# Patient Record
Sex: Female | Born: 1980 | Race: Black or African American | Hispanic: No | Marital: Single | Smoking: Never smoker
Health system: Southern US, Community
[De-identification: ages and names within clinical notes are randomized; demographics above are authoritative.]

## PROBLEM LIST (undated history)

## (undated) ENCOUNTER — Emergency Department: Payer: BLUE CROSS/BLUE SHIELD

## (undated) ENCOUNTER — Emergency Department (HOSPITAL_COMMUNITY): Payer: BLUE CROSS/BLUE SHIELD | Source: Home / Self Care

## (undated) DIAGNOSIS — G8929 Other chronic pain: Secondary | ICD-10-CM

## (undated) DIAGNOSIS — I2699 Other pulmonary embolism without acute cor pulmonale: Secondary | ICD-10-CM

## (undated) DIAGNOSIS — M549 Dorsalgia, unspecified: Secondary | ICD-10-CM

## (undated) DIAGNOSIS — I82409 Acute embolism and thrombosis of unspecified deep veins of unspecified lower extremity: Secondary | ICD-10-CM

## (undated) HISTORY — PX: WRIST FRACTURE SURGERY: SHX121

## (undated) HISTORY — PX: WISDOM TOOTH EXTRACTION: SHX21

## (undated) HISTORY — PX: BACK SURGERY: SHX140

---

## 2014-02-28 ENCOUNTER — Inpatient Hospital Stay (HOSPITAL_BASED_OUTPATIENT_CLINIC_OR_DEPARTMENT_OTHER)
Admission: EM | Admit: 2014-02-28 | Discharge: 2014-03-03 | DRG: 176 | Disposition: A | Discharge status: Home / Self Care | Payer: BLUE CROSS/BLUE SHIELD | Attending: Internal Medicine | Admitting: Internal Medicine

## 2014-02-28 ENCOUNTER — Emergency Department (HOSPITAL_BASED_OUTPATIENT_CLINIC_OR_DEPARTMENT_OTHER): Payer: BLUE CROSS/BLUE SHIELD

## 2014-02-28 ENCOUNTER — Encounter (HOSPITAL_BASED_OUTPATIENT_CLINIC_OR_DEPARTMENT_OTHER): Payer: Self-pay | Admitting: *Deleted

## 2014-02-28 DIAGNOSIS — M5127 Other intervertebral disc displacement, lumbosacral region: Secondary | ICD-10-CM | POA: Diagnosis present

## 2014-02-28 DIAGNOSIS — R0602 Shortness of breath: Secondary | ICD-10-CM | POA: Diagnosis present

## 2014-02-28 DIAGNOSIS — I82432 Acute embolism and thrombosis of left popliteal vein: Secondary | ICD-10-CM | POA: Diagnosis present

## 2014-02-28 DIAGNOSIS — I82412 Acute embolism and thrombosis of left femoral vein: Secondary | ICD-10-CM | POA: Diagnosis present

## 2014-02-28 DIAGNOSIS — R748 Abnormal levels of other serum enzymes: Secondary | ICD-10-CM | POA: Diagnosis present

## 2014-02-28 DIAGNOSIS — Z79899 Other long term (current) drug therapy: Secondary | ICD-10-CM | POA: Diagnosis not present

## 2014-02-28 DIAGNOSIS — M7989 Other specified soft tissue disorders: Secondary | ICD-10-CM | POA: Diagnosis present

## 2014-02-28 DIAGNOSIS — D72829 Elevated white blood cell count, unspecified: Secondary | ICD-10-CM | POA: Diagnosis present

## 2014-02-28 DIAGNOSIS — I2699 Other pulmonary embolism without acute cor pulmonale: Secondary | ICD-10-CM

## 2014-02-28 DIAGNOSIS — I2692 Saddle embolus of pulmonary artery without acute cor pulmonale: Principal | ICD-10-CM | POA: Diagnosis present

## 2014-02-28 DIAGNOSIS — R519 Headache, unspecified: Secondary | ICD-10-CM

## 2014-02-28 DIAGNOSIS — E871 Hypo-osmolality and hyponatremia: Secondary | ICD-10-CM | POA: Diagnosis present

## 2014-02-28 DIAGNOSIS — R51 Headache: Secondary | ICD-10-CM

## 2014-02-28 HISTORY — DX: Dorsalgia, unspecified: M54.9

## 2014-02-28 HISTORY — DX: Other chronic pain: G89.29

## 2014-02-28 LAB — COMPREHENSIVE METABOLIC PANEL
ALT: 34 U/L (ref 0–35)
AST: 34 U/L (ref 0–37)
Albumin: 3.9 g/dL (ref 3.5–5.2)
Alkaline Phosphatase: 96 U/L (ref 39–117)
Anion gap: 8 (ref 5–15)
BUN: 14 mg/dL (ref 6–23)
CO2: 24 mmol/L (ref 19–32)
CREATININE: 0.83 mg/dL (ref 0.50–1.10)
Calcium: 9 mg/dL (ref 8.4–10.5)
Chloride: 102 mmol/L (ref 96–112)
Glucose, Bld: 157 mg/dL — ABNORMAL HIGH (ref 70–99)
POTASSIUM: 4.3 mmol/L (ref 3.5–5.1)
Sodium: 134 mmol/L — ABNORMAL LOW (ref 135–145)
Total Bilirubin: 0.2 mg/dL — ABNORMAL LOW (ref 0.3–1.2)
Total Protein: 8.4 g/dL — ABNORMAL HIGH (ref 6.0–8.3)

## 2014-02-28 LAB — APTT
APTT: 37 s (ref 24–37)
aPTT: 98 seconds — ABNORMAL HIGH (ref 24–37)
aPTT: 69 seconds — ABNORMAL HIGH (ref 24–37)

## 2014-02-28 LAB — MRSA PCR SCREENING: MRSA by PCR: NEGATIVE

## 2014-02-28 LAB — CBC
HEMATOCRIT: 39.5 % (ref 36.0–46.0)
HEMATOCRIT: 36.6 % (ref 36.0–46.0)
HEMOGLOBIN: 13.3 g/dL (ref 12.0–15.0)
HEMOGLOBIN: 12.3 g/dL (ref 12.0–15.0)
MCH: 28.2 pg (ref 26.0–34.0)
MCH: 28 pg (ref 26.0–34.0)
MCHC: 33.7 g/dL (ref 30.0–36.0)
MCHC: 33.6 g/dL (ref 30.0–36.0)
MCV: 83.9 fL (ref 78.0–100.0)
MCV: 83.4 fL (ref 78.0–100.0)
PLATELETS: 210 10*3/uL (ref 150–400)
Platelets: 185 10*3/uL (ref 150–400)
RBC: 4.71 MIL/uL (ref 3.87–5.11)
RBC: 4.39 MIL/uL (ref 3.87–5.11)
RDW: 14.4 % (ref 11.5–15.5)
RDW: 14.3 % (ref 11.5–15.5)
WBC: 19.1 10*3/uL — ABNORMAL HIGH (ref 4.0–10.5)
WBC: 24.3 10*3/uL — AB (ref 4.0–10.5)

## 2014-02-28 LAB — PROTIME-INR
INR: 1.11 (ref 0.00–1.49)
Prothrombin Time: 14.3 seconds (ref 11.6–15.2)
Prothrombin Time: >90 seconds — ABNORMAL HIGH (ref 11.6–15.2)

## 2014-02-28 LAB — TROPONIN I: TROPONIN I: 0.2 ng/mL — AB (ref <0.031)

## 2014-02-28 LAB — GLUCOSE, CAPILLARY: Glucose-Capillary: 163 mg/dL — ABNORMAL HIGH (ref 70–99)

## 2014-02-28 MED ORDER — ALTEPLASE (PULMONARY EMBOLISM) INFUSION
100.0000 mg | Freq: Once | INTRAVENOUS | Status: AC
  Administered 2014-02-28: 100 mg via INTRAVENOUS
  Filled 2014-02-28: qty 100

## 2014-02-28 MED ORDER — HYDROMORPHONE HCL 1 MG/ML IJ SOLN
1.0000 mg | Freq: Once | INTRAMUSCULAR | Status: AC
  Administered 2014-02-28: 1 mg via INTRAVENOUS
  Filled 2014-02-28: qty 1

## 2014-02-28 MED ORDER — MIDAZOLAM HCL 2 MG/2ML IJ SOLN
INTRAMUSCULAR | Status: AC
  Filled 2014-02-28: qty 2

## 2014-02-28 MED ORDER — SODIUM CHLORIDE 0.9 % IV SOLN
250.0000 mL | Freq: Once | INTRAVENOUS | Status: AC
  Administered 2014-02-28: 250 mL via INTRAVENOUS

## 2014-02-28 MED ORDER — SODIUM CHLORIDE 0.9 % IV SOLN
INTRAVENOUS | Status: DC
  Administered 2014-02-28 – 2014-03-01 (×3): via INTRAVENOUS

## 2014-02-28 MED ORDER — HEPARIN (PORCINE) IN NACL 100-0.45 UNIT/ML-% IJ SOLN
1700.0000 [IU]/h | INTRAMUSCULAR | Status: AC
  Administered 2014-02-28: 1100 [IU]/h via INTRAVENOUS
  Administered 2014-03-01: 1350 [IU]/h via INTRAVENOUS
  Administered 2014-03-02: 1700 [IU]/h via INTRAVENOUS
  Filled 2014-02-28 (×5): qty 250

## 2014-02-28 MED ORDER — HEPARIN (PORCINE) IN NACL 100-0.45 UNIT/ML-% IJ SOLN
1600.0000 [IU]/h | INTRAMUSCULAR | Status: DC
  Administered 2014-02-28: 1600 [IU]/h via INTRAVENOUS

## 2014-02-28 MED ORDER — FENTANYL CITRATE 0.05 MG/ML IJ SOLN
25.0000 ug | INTRAMUSCULAR | Status: DC | PRN
  Administered 2014-02-28: 50 ug via INTRAVENOUS

## 2014-02-28 MED ORDER — HEPARIN (PORCINE) IN NACL 100-0.45 UNIT/ML-% IJ SOLN
INTRAMUSCULAR | Status: AC
  Filled 2014-02-28: qty 250

## 2014-02-28 MED ORDER — PANTOPRAZOLE SODIUM 40 MG IV SOLR
40.0000 mg | INTRAVENOUS | Status: DC
  Administered 2014-02-28: 40 mg via INTRAVENOUS
  Filled 2014-02-28 (×2): qty 40

## 2014-02-28 MED ORDER — FENTANYL CITRATE 0.05 MG/ML IJ SOLN
INTRAMUSCULAR | Status: AC
  Filled 2014-02-28: qty 2

## 2014-02-28 MED ORDER — IOHEXOL 350 MG/ML SOLN
100.0000 mL | Freq: Once | INTRAVENOUS | Status: AC | PRN
  Administered 2014-02-28: 100 mL via INTRAVENOUS

## 2014-02-28 MED ORDER — HEPARIN BOLUS VIA INFUSION
5000.0000 [IU] | Freq: Once | INTRAVENOUS | Status: AC
  Administered 2014-02-28 (×2): 5000 [IU] via INTRAVENOUS

## 2014-02-28 MED ORDER — FENTANYL CITRATE 0.05 MG/ML IJ SOLN
25.0000 ug | INTRAMUSCULAR | Status: DC | PRN
  Administered 2014-02-28 – 2014-03-01 (×11): 50 ug via INTRAVENOUS
  Filled 2014-02-28 (×8): qty 2

## 2014-02-28 NOTE — ED Notes (Signed)
MD at bedside. 

## 2014-02-28 NOTE — Procedures (Signed)
Echo liited  1. LV hyperdynamic, LVH 2. No sig pericardial effusion 3. RV severe dilation, sever hypokinetic, D shape ventricle with gross RV overload and septal flat  Mcarthur Rossettianiel J. Tyson AliasFeinstein, MD, FACP Pgr: [phone] [facility]

## 2014-02-28 NOTE — ED Notes (Signed)
Patient transported to X-ray 

## 2014-02-28 NOTE — Progress Notes (Addendum)
ANTICOAGULATION CONSULT NOTE - Follow Up Consult  Pharmacy Consult for Heparin  Indication: pulmonary embolus, s/p 2hr tPA infusion   Allergies  Allergen Reactions  . Mobic [Meloxicam] Swelling    Patient Measurements: Height: 5\' 11"  (180.3 cm) Weight: 229 lb (103.874 kg) IBW/kg (Calculated) : 70.8  Vital Signs: Temp: 98.7 F (37.1 C) (02/04 2001) Temp Source: Oral (02/04 2001) BP: 111/69 mmHg (02/04 2300) Pulse Rate: 103 (02/04 2300)  Labs:  Recent Labs  02/28/14 1355 02/28/14 2120 02/28/14 2314  HGB 13.3 12.3  --   HCT 39.5 36.6  --   PLT 210 185  --   APTT 37 98* 69*  LABPROT 14.3 >90.0*  --   INR 1.11 >10.00*  --   CREATININE 0.83  --   --   TROPONINI 0.20*  --   --     Estimated Creatinine Clearance: 126.6 mL/min (by C-G formula based on Cr of 0.83).  Assessment: Large PE, aPTT now less than 80 s/p 2hr tPA infusion, to start heparin.   Goal of Therapy:  Heparin level 0.3-0.5 units/mL (s/p tPA) Monitor platelets by anticoagulation protocol: Yes   Plan:  -Start heparin drip at 1100 units/hr -0700 HL -Daily CBC/HL -Monitor for bleeding -F/U start of oral anti-coagulation   Paula Mack 02/28/2014,11:52 PM =============================================  Addendum 7:23 AM HL this AM 0.1, no issues per RN -Increase heparin drip to 1350 units/hr -1400 HL JLedford, PharmD

## 2014-02-28 NOTE — ED Notes (Signed)
Called to US , pt c/o increased SOB and being lightheaded. O2 sat 88 percent , RR 32 HR 145, O2 increased to 4 liters and nurse stayed with pt until us completed.

## 2014-02-28 NOTE — ED Notes (Addendum)
Pt c/o being short of breath and right side chest pain since yesterday. She denies any other symptoms. She is scheduled for back surgery next week.

## 2014-02-28 NOTE — H&P (Signed)
PULMONARY / CRITICAL CARE MEDICINE   Name: Paula Mack MRN: [id number] DOB: July 11, 1980    ADMISSION DATE:  02/28/2014 CONSULTATION DATE:  02/28/2014  REFERRING MD :  [facility]  CHIEF COMPLAINT:  Shortness of breath  INITIAL PRESENTATION: Presented from [facility] after acute PE found on CTA with associated right heart strain with an RV/LV ratio of 3.0. Paula Mack started on heparin and arrived tachycardic up to 140s, initially on [place] with desat down to 88% and escalated to non-rebreather. Unstable hemodynamically unstable equivalent with rr 40, 100% nrb to receive TPA by IR.  STUDIES:  LE duplex (2/4): nonocclusive thrombus in L fem/popliteal v CTA (2/4): acute PE w/ r. Heart strain MRSA 2/4>> Factor 5 leiden 2/4>> Homocysteine 2/4>> Prothrombin gene mutation 2/4>>  SIGNIFICANT EVENTS: 2/4: Paula Mack admitted to unit; received systemic TPA  HISTORY OF PRESENT ILLNESS:  Paula Mack presented to [facility] after 3 days of progressive sharp chest pain and shortness of breath. She reports no hemoptysis. Breathing makes her pain worse. She has a history of 3 weeks of low mobility due to back pain from a L5-S1 disc protrusion. She was scheduled for surgery in a week with [facility]. Prior to her immobility, she worked at a job where she was sedentary. She was also using OCPs. No prior history of DVT or PE. No known family history or personal history of clotting disorders.  PAST MEDICAL HISTORY :   has a past medical history of Chronic back pain.  has past surgical history that includes Wisdom tooth extraction. Prior to Admission medications   Medication Sig Start Date End Date Taking? Authorizing Provider  gabapentin (NEURONTIN) 300 MG capsule Take 300 mg by mouth 2 (two) times daily.   Yes Historical Provider, MD  HYDROcodone-acetaminophen (NORCO) 7.5-325 MG per tablet Take 1 tablet by mouth every 6 (six) hours as needed for moderate pain.   Yes Historical Provider,  MD  oxyCODONE-acetaminophen (PERCOCET/ROXICET) 5-325 MG per tablet Take by mouth every 4 (four) hours as needed for severe pain.   Yes Historical Provider, MD  traMADol (ULTRAM) 50 MG tablet Take by mouth every 6 (six) hours as needed.   Yes Historical Provider, MD   Allergies  Allergen Reactions  . Mobic [Meloxicam] Swelling    FAMILY HISTORY:  has no family status information on file.  SOCIAL HISTORY:  reports that she has never smoked. She does not have any smokeless tobacco history on file. She reports that she does not drink alcohol or use illicit drugs.  REVIEW OF SYSTEMS:  Chest pain, shortness of breath  SUBJECTIVE:  Paula Mack reports shortness of breath and chest pain are worsened. Also having back pain as she was recently manipulated on bed. States she usually does not lie flat on her back.   VITAL SIGNS: Temp:  [98.2 F (36.8 C)-98.3 F (36.8 C)] 98.2 F (36.8 C) (02/04 1755) Pulse Rate:  [132-145] 132 (02/04 1800) Resp:  [26-36] 36 (02/04 1800) BP: (95-122)/(62-79) 106/72 mmHg (02/04 1800) SpO2:  [88 %-100 %] 99 % (02/04 1800) Weight:  [229 lb (103.874 kg)] 229 lb (103.874 kg) (02/04 1339) HEMODYNAMICS:   VENTILATOR SETTINGS:    INTAKE / OUTPUT: No intake or output data in the 24 hours ending 02/28/14 1847  PHYSICAL EXAMINATION: General:  Appears in moderate distress Neuro:  Alert, oriented, PERRL HEENT:  [place]/AT, non-rebreather fitted Cardiovascular:  Tachycardia, regular rhythm, no murmurs heard Lungs: anterior auscultation. Right lower base diminished, otherwise clear throughout Abdomen:  Soft, non-tender,  non-distended Musculoskeletal:  Bilateral legs with negligible edema. No swelling or erythema noted. Moves extremities spontaneously Skin:  No cyanosis, no rashes  LABS:  CBC  Recent Labs Lab 02/28/14 1355  WBC 19.1*  HGB 13.3  HCT 39.5  PLT 210   Coag's  Recent Labs Lab 02/28/14 1355  APTT 37  INR 1.11   BMET  Recent Labs Lab  02/28/14 1355  NA 134*  K 4.3  CL 102  CO2 24  BUN 14  CREATININE 0.83  GLUCOSE 157*   Electrolytes  Recent Labs Lab 02/28/14 1355  CALCIUM 9.0   Sepsis Markers No results for input(s): LATICACIDVEN, PROCALCITON, O2SATVEN in the last 168 hours. ABG No results for input(s): PHART, PCO2ART, PO2ART in the last 168 hours. Liver Enzymes  Recent Labs Lab 02/28/14 1355  AST 34  ALT 34  ALKPHOS 96  BILITOT 0.2*  ALBUMIN 3.9   Cardiac Enzymes  Recent Labs Lab 02/28/14 1355  TROPONINI 0.20*   Glucose  Recent Labs Lab 02/28/14 1754  GLUCAP 163*    Imaging  Dg Chest 2 View  02/28/2014   CLINICAL DATA:  Right-sided chest pain and shortness of breath for 2 days  EXAM: CHEST  2 VIEW  COMPARISON:  None.  FINDINGS: There is patchy infiltrate in the lateral right base. Elsewhere lungs are clear. Heart size and pulmonary vascularity are within normal limits. No adenopathy. No bone lesions.  IMPRESSION: Lateral right base infiltrate.   Electronically Signed   By: Bretta Bang M.D.   On: 02/28/2014 14:46   Ct Angio Chest Pe W/cm &/or Wo Cm  02/28/2014   CLINICAL DATA:  Shortness of breath and chest pain for 1 day  EXAM: CT ANGIOGRAPHY CHEST WITH CONTRAST  TECHNIQUE: Multidetector CT imaging of the chest was performed using the standard protocol during bolus administration of intravenous contrast. Multiplanar CT image reconstructions and MIPs were obtained to evaluate the vascular anatomy.  CONTRAST:  OMNIPAQUE IOHEXOL 350 MG/ML SOLN  COMPARISON:  Chest radiograph February 28, 2014  FINDINGS: There is extensive pulmonary emboli arising from both main pulmonary arteries with extension of pulmonary emboli into major branches of both upper and lower lobe pulmonary arteries. There is a small amount of thrombus in the distal main pulmonary outflow tract. There is right heart strain with a right ventricle to left ventricular diameter of approximately 3, markedly abnormal.  There is  no appreciable thoracic aortic aneurysm or dissection.  There is airspace consolidation in the anterior and lateral segments of the right lower lobe. A smaller amount of infiltrate is present in the posterior segment right lower lobe. Elsewhere, the lungs are clear.  There is no appreciable thoracic adenopathy. Mild residual thymic tissue may be within normal limits for age. The pericardium is not thickened.  In the visualized upper abdomen, no lesion is seen. There are no blastic or lytic bone lesions. Thyroid appears normal.  Review of the MIP images confirms the above findings.  IMPRESSION: Positive for acute PE with CTevidence of right heart strain (RV/LV Ratio = 3.0) consistent with at least submassive (intermediate risk) PE. The presence of right heart strain has been associated with an increased risk of morbidity and mortality. Pulmonary emboli extending from the distal main pulmonary outflow tract through both main pulmonary arteries and into multiple branches of the lower and upper lobe pulmonary arteries bilaterally.  Right lower lobe airspace consolidation.  No adenopathy.  Critical Value/emergent results were called by telephone at the time of  interpretation on 02/28/2014 at 3:35 pm to Dr. Linwood Dibbles , who verbally acknowledged these results.   Electronically Signed   By: Bretta Bang M.D.   On: 02/28/2014 15:36   US Venous Img Lower Unilateral Left  02/28/2014   CLINICAL DATA:  Left leg swelling below the knee for 3 months. Shortness of breath for 2 days.  EXAM: LEFT LOWER EXTREMITY VENOUS DOPPLER ULTRASOUND  TECHNIQUE: Gray-scale sonography with graded compression, as well as color Doppler and duplex ultrasound, were performed to evaluate the deep venous system from the level of the common femoral vein through the popliteal and proximal calf veins. Spectral Doppler was utilized to evaluate flow at rest and with distal augmentation maneuvers.  COMPARISON:  None.  FINDINGS: The right common femoral  vein is patent.  Normal compressibility, color Doppler flow and phasicity in the left common femoral vein. The left saphenofemoral junction is patent. Left profunda femoral vein is patent. Partial compressibility of the left femoral vein with some color Doppler flow. 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 Partial compressibility of the left popliteal vein with some flow. Findings are compatible with nonocclusive thrombus in the left femoral vein and left popliteal vein.  Images of the left calf are limited. According to the sonographer, the peroneal veins in the mid calf did not compress and at least 1 posterior tibial vein in the mid calf did not compress.  Visualized left great saphenous vein is patent.  IMPRESSION: Positive for deep vein thrombosis in the left lower extremity. There is nonocclusive thrombus in the left femoral vein and left popliteal vein. Probable deep vein thrombosis involving left calf veins which are not well imaged.   Electronically Signed   By: Richarda Overlie M.D.   On: 02/28/2014 15:21    ASSESSMENT / PLAN:  PULMONARY A: Massive acute saddle PE. Spoke with physician from [place] who confirmed no risk with using TPA with patients current L5-S1 bulging disc Respiratory distress, tachypnea P:   Oxygen to keep O2 sat >94% Immediate systemic TPA since Paula Mack is becoming increasingly unstable with concern rapid deterioration Would NOT send to IR, risk of declines in setting of drip slow and transfer too high Restart heparin after TPA for ptt evalaution See heme IS  CARDIOVASCULAR A:  Right heart strain w/ RV/LV ratio of 3.0, Occult RV failure on my echo Sinus tachycardia secondary to PE Elevated troponin 0.2, rv failure related R/o LVH ( i saw on echo) R./o any prior pa htn ( unlikely with acute presentation ) P:  Manage as above Monitor blood pressures. MAP goal >65 official echo in am  Volume to some extent  RENAL A:    Hyponatremia  P:   Monitor metabolic panel IVF NS@125ml /hr Foley catheter  GASTROINTESTINAL A:  No acute issues P:  NPO NS @ 185ml/hr  HEMATOLOGIC A:  Leukocytosis, risk ICH P:  Monitor CBC TPA and heparin as outlined above Neurochecks, ensure blood type in case cryo needed if neuro change limit blood draws  INFECTIOUS A: No acute issues P:   MRSA PCR 2/4>>  ENDOCRINE A:  No acute issues     NEUROLOGIC A:  L5-S1 disc protrusion Low risk ICH P:   RASS goal: 0 Fentanyl 25-49mcg q2hrs PRN neurochecks while with tpa   FAMILY  - Updates: Family updated of the plan  - Inter-disciplinary family meet or Palliative Care meeting due by:  03/07/2014    TODAY'S SUMMARY: Paula Mack with  a massive acute saddle PE with subsequent right heart strain, elevated troponin, tachycardia and worsening respiratory status requiring systemic TPA due to unstable nature of presentation.     Jacquelin Hawkingalph Nettey, MD PGY-2, [facility] 02/28/2014, 6:47 PM  Pulmonary and Critical Care Medicine [facility] Pager: [phone]  STAFF NOTE: I, Rory Percyaniel Essex Perry, MD FACP have personally reviewed Paula Mack's available data, including medical history, events of note, physical examination and test results as part of my evaluation. I have discussed with resident/NP and other care providers such as pharmacist, RN and RRT. In addition, I personally evaluated Paula Mack and elicited key findings UJ:[id number]:distress getting woren 100%, just arrived from [facility], tachy 140, rr climbing - all hemodynamically unstable equivalents, too high risk to decline with IR, i called her ortho no risks or complicating features to back disc, STAT tpa systemic, neurochecks, homocytein, pt gene mutation, at3, c, s in future, NO more OCP ever, I did stat echo, see result The Paula Mack is critically ill with multiple organ systems failure and requires high complexity decision making for assessment and  support, frequent evaluation and titration of therapies, application of advanced monitoring technologies and extensive interpretation of multiple databases.   Critical Care Time devoted to Paula Mack care services described in this note is 60 Minutes. This time reflects time of care of this signee: Rory Percyaniel Amy Gothard, MD FACP. This critical care time does not reflect procedure time, or teaching time or supervisory time of PA/NP/Med student/Med Resident etc but could involve care discussion time. Rest per NP/medical resident whose note is outlined above and that I agree with   Mcarthur Rossettianiel J. Tyson AliasFeinstein, MD, FACP Pgr: [phone] [facility] 02/28/2014 8:20 PM

## 2014-02-28 NOTE — ED Notes (Signed)
Family at bedside. 

## 2014-02-28 NOTE — ED Provider Notes (Addendum)
CSN: [id number]     Arrival date & time 02/28/14  1338 History   First MD Initiated Contact with Patient 02/28/14 1347     Chief Complaint  Patient presents with  . Shortness of Breath    Patient is a 34 y.o. female presenting with shortness of breath. The history is provided by the patient.  Shortness of Breath Severity:  Severe Onset quality:  Sudden Duration:  1 day Timing:  Constant Progression:  Worsening Relieved by:  Nothing Worsened by:  Activity Associated symptoms: chest pain   Chest pain:    Quality:  Sharp (pleuritic)   Severity:  Severe   Duration:  1 day   Timing:  Constant  pain is sharp on the right side of her chest. It increases with deep breathing and lying flat.  The pain radiates posteriorly to the right side of her thoracic region. She has a history of lower back pain and is scheduled for surgery next week but has not had any issues with pain in her upper back before.  She has noticed some leg swelling of the left leg. She denies any prior history of PE, DVT, pneumothorax or cardiac disease.  Past Medical History  Diagnosis Date  . Chronic back pain    Past Surgical History  Procedure Laterality Date  . Wisdom tooth extraction     No family history on file. History  Substance Use Topics  . Smoking status: Never Smoker   . Smokeless tobacco: Not on file  . Alcohol Use: No   OB History    No data available     Review of Systems  Respiratory: Positive for shortness of breath.   Cardiovascular: Positive for chest pain.  All other systems reviewed and are negative.     Allergies  Mobic  Home Medications   Prior to Admission medications   Medication Sig Start Date End Date Taking? Authorizing Provider  gabapentin (NEURONTIN) 300 MG capsule Take 300 mg by mouth 2 (two) times daily.   Yes Historical Provider, MD  HYDROcodone-acetaminophen (NORCO) 7.5-325 MG per tablet Take 1 tablet by mouth every 6 (six) hours as needed for moderate pain.   Yes  Historical Provider, MD  oxyCODONE-acetaminophen (PERCOCET/ROXICET) 5-325 MG per tablet Take by mouth every 4 (four) hours as needed for severe pain.   Yes Historical Provider, MD  traMADol (ULTRAM) 50 MG tablet Take by mouth every 6 (six) hours as needed.   Yes Historical Provider, MD   BP 121/77 mmHg  Pulse 136  Temp(Src) 98.3 F (36.8 C)  Resp 34  Ht 5\' 11"  (1.803 m)  Wt 229 lb (103.874 kg)  BMI 31.95 kg/m2  SpO2 99% Physical Exam  Constitutional: She appears well-developed and well-nourished.  Appears uncomfortable  HENT:  Head: Normocephalic and atraumatic.  Right Ear: External ear normal.  Left Ear: External ear normal.  Eyes: Conjunctivae are normal. Right eye exhibits no discharge. Left eye exhibits no discharge. No scleral icterus.  Neck: Neck supple. No tracheal deviation present.  Cardiovascular: Regular rhythm and intact distal pulses.  Tachycardia present.   Pulmonary/Chest: Effort normal and breath sounds normal. No stridor. Tachypnea noted. She has no wheezes. She has no rales.  Abdominal: Soft. Bowel sounds are normal. She exhibits no distension. There is no tenderness. There is no rebound and no guarding.  No tenderness in the right upper quadrant  Musculoskeletal:       Left lower leg: She exhibits tenderness and swelling.  Neurological: She is alert.  She has normal strength. No cranial nerve deficit (no facial droop, extraocular movements intact, no slurred speech) or sensory deficit. She exhibits normal muscle tone. She displays no seizure activity. Coordination normal.  Skin: Skin is warm and dry. No rash noted. She is not diaphoretic.  Psychiatric: She has a normal mood and affect.  Nursing note and vitals reviewed.   ED Course  Procedures (including critical care time) CRITICAL CARE Performed by: WUJWJ,XBJ Total critical care time: 45 Critical care time was exclusive of separately billable procedures and treating other patients. Critical care was  necessary to treat or prevent imminent or life-threatening deterioration. Critical care was time spent personally by me on the following activities: development of treatment plan with patient and/or surrogate as well as nursing, discussions with consultants, evaluation of patient's response to treatment, examination of patient, obtaining history from patient or surrogate, ordering and performing treatments and interventions, ordering and review of laboratory studies, ordering and review of radiographic studies, pulse oximetry and re-evaluation of patient's condition.  Labs Review Labs Reviewed  CBC - Abnormal; Notable for the following:    WBC 19.1 (*)    All other components within normal limits  COMPREHENSIVE METABOLIC PANEL - Abnormal; Notable for the following:    Sodium 134 (*)    Glucose, Bld 157 (*)    Total Protein 8.4 (*)    Total Bilirubin 0.2 (*)    All other components within normal limits  TROPONIN I - Abnormal; Notable for the following:    Troponin I 0.20 (*)    All other components within normal limits  APTT  PROTIME-INR  HEPARIN LEVEL (UNFRACTIONATED)    Imaging Review Dg Chest 2 View  02/28/2014   CLINICAL DATA:  Right-sided chest pain and shortness of breath for 2 days  EXAM: CHEST  2 VIEW  COMPARISON:  None.  FINDINGS: There is patchy infiltrate in the lateral right base. Elsewhere lungs are clear. Heart size and pulmonary vascularity are within normal limits. No adenopathy. No bone lesions.  IMPRESSION: Lateral right base infiltrate.   Electronically Signed   By: Bretta Bang M.D.   On: 02/28/2014 14:46   US Venous Img Lower Unilateral Left  02/28/2014   CLINICAL DATA:  Left leg swelling below the knee for 3 months. Shortness of breath for 2 days.  EXAM: LEFT LOWER EXTREMITY VENOUS DOPPLER ULTRASOUND  TECHNIQUE: Gray-scale sonography with graded compression, as well as color Doppler and duplex ultrasound, were performed to evaluate the deep venous system from the  level of the common femoral vein through the popliteal and proximal calf veins. Spectral Doppler was utilized to evaluate flow at rest and with distal augmentation maneuvers.  COMPARISON:  None.  FINDINGS: The right common femoral vein is patent.  Normal compressibility, color Doppler flow and phasicity in the left common femoral vein. The left saphenofemoral junction is patent. Left profunda femoral vein is patent. Partial compressibility of the left femoral vein with some color Doppler flow. 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 Partial compressibility of the left popliteal vein with some flow. Findings are compatible with nonocclusive thrombus in the left femoral vein and left popliteal vein.  Images of the left calf are limited. According to the sonographer, the peroneal veins in the mid calf did not compress and at least 1 posterior tibial vein in the mid calf did not compress.  Visualized left great saphenous vein is patent.  IMPRESSION: Positive for deep vein thrombosis  in the left lower extremity. There is nonocclusive thrombus in the left femoral vein and left popliteal vein. Probable deep vein thrombosis involving left calf veins which are not well imaged.   Electronically Signed   By: Richarda Overlie M.D.   On: 02/28/2014 15:21     EKG Interpretation   Date/Time:  Thursday February 28 2014 13:50:16 EST Ventricular Rate:  137 PR Interval:  148 QRS Duration: 78 QT Interval:  292 QTC Calculation: 440 R Axis:   75 Text Interpretation:  Sinus tachycardia Otherwise normal ECG No previous  tracing Confirmed by Bentley Fissel  MD-J, Ean Gettel ([id number]) on 02/28/2014 1:52:34 PM     Medications  0.9 %  sodium chloride infusion ( Intravenous New Bag/Given 02/28/14 1420)  heparin 100-0.45 UNIT/ML-% infusion (not administered)  heparin ADULT infusion 100 units/mL (25000 units/250 mL) (1,600 Units/hr Intravenous New Bag/Given 02/28/14 1524)  heparin bolus via infusion 5,000 Units  (not administered)  HYDROmorphone (DILAUDID) injection 1 mg (1 mg Intravenous Given 02/28/14 1417)  iohexol (OMNIPAQUE) 350 MG/ML injection 100 mL (100 mLs Intravenous Contrast Given 02/28/14 1507)    MDM   Final diagnoses:  PE (pulmonary thromboembolism)    The patient's ultrasound shows a deep venous thrombosis in the left lower extremity.   Patient was started on heparin.  I suspected that the finding on chest x-ray was most likely related to a pulmonary embolism rather than a pneumonia. CT scan of the chest was ordered. Pulmonary finding from the radiologist indicates the patient does have massive pulmonary embolism with a saddle embolism.  Patient remains tachycardic but is oxygenating well and her blood pressure is stable.  She does have elevated troponin and persistent tachycardia suggesting a component of right heart strain. Patient will need to be monitored very closely. Consideration of thrombolysis? I'll consult pulmonary critical care at [facility] to see if she is a candidate for the ICU versus the stepdown unit.    Linwood Dibbles, MD 02/28/14 1534  Pt will be admitted to [facility] ICU.  Dr Tyson Alias is the attending.  Family and patient have been updated.  Linwood Dibbles, MD 02/28/14 [phone]

## 2014-02-28 NOTE — Progress Notes (Signed)
ANTICOAGULATION CONSULT NOTE - Initial Consult  Pharmacy Consult for Heparin Indication: R/O PE  Allergies  Allergen Reactions  . Mobic [Meloxicam] Swelling    Patient Measurements: Height: 5\' 11"  (180.3 cm) Weight: 229 lb (103.874 kg) IBW/kg (Calculated) : 70.8 Heparin Dosing Weight: 93 kg  Vital Signs: Temp: 98.3 F (36.8 C) (02/04 1339) BP: 95/70 mmHg (02/04 1453) Pulse Rate: 145 (02/04 1453)  Labs:  Recent Labs  02/28/14 1355  HGB 13.3  HCT 39.5  PLT 210  CREATININE 0.83  TROPONINI 0.20*    Estimated Creatinine Clearance: 126.6 mL/min (by C-G formula based on Cr of 0.83).   Medical History: Past Medical History  Diagnosis Date  . Chronic back pain     Medications:   (Not in a hospital admission)  Assessment: 34 yo F [facility] with SOB. No significant PMH or PTA meds. Rx to dose heparin for possible PE. Hgb and plts wnl. No s/s of bleed  Goal of Therapy:  Heparin level 0.3-0.7 units/ml Monitor platelets by anticoagulation protocol: Yes   Plan:  Heparin BOLUS of 5,000 units Start heparin gtt at 1600 units/hr Check 6hr HL at 2130 tonight Monitor daily HL, CBC, s/s of bleed  Armandina StammerBATCHELDER,Brandyn Thien J 02/28/2014,3:05 PM

## 2014-02-28 NOTE — Progress Notes (Signed)
Alteplase started at 50 ml to infuse over 2 hours.

## 2014-02-28 NOTE — ED Notes (Signed)
Attempt to call report nurse not available 

## 2014-02-28 NOTE — Progress Notes (Signed)
Patient arrived to unit a/o x4. HR upper 130's, Bp 119/74 , NRB, RR 36. Heparin gtt 1600 units. Blood sugar 163. CHG wipes used. C/o lower back pain.

## 2014-02-28 NOTE — ED Notes (Signed)
Patient transported to Ultrasound 

## 2014-02-28 NOTE — Progress Notes (Signed)
[facility] Result taken by: Lowella BandyNikki, RN  Critical Lab: INR >10  Time Called: 11:45 PM  Intervention ordered: n/a  Physician: Renae Ficklen/a  Nikeria Kalman Sara, RN

## 2014-03-01 DIAGNOSIS — I2699 Other pulmonary embolism without acute cor pulmonale: Secondary | ICD-10-CM

## 2014-03-01 LAB — PREGNANCY, URINE: Preg Test, Ur: NEGATIVE

## 2014-03-01 LAB — CBC
HCT: 34 % — ABNORMAL LOW (ref 36.0–46.0)
Hemoglobin: 11.4 g/dL — ABNORMAL LOW (ref 12.0–15.0)
MCH: 28 pg (ref 26.0–34.0)
MCHC: 33.5 g/dL (ref 30.0–36.0)
MCV: 83.5 fL (ref 78.0–100.0)
PLATELETS: 191 10*3/uL (ref 150–400)
RBC: 4.07 MIL/uL (ref 3.87–5.11)
RDW: 14.2 % (ref 11.5–15.5)
WBC: 18 10*3/uL — AB (ref 4.0–10.5)

## 2014-03-01 LAB — HEPARIN LEVEL (UNFRACTIONATED)
HEPARIN UNFRACTIONATED: 0.1 [IU]/mL — AB (ref 0.30–0.70)
HEPARIN UNFRACTIONATED: 0.11 [IU]/mL — AB (ref 0.30–0.70)
HEPARIN UNFRACTIONATED: 0.37 [IU]/mL (ref 0.30–0.70)

## 2014-03-01 MED ORDER — OXYCODONE-ACETAMINOPHEN 5-325 MG PO TABS
1.0000 | ORAL_TABLET | ORAL | Status: DC | PRN
  Administered 2014-03-01 – 2014-03-02 (×5): 1 via ORAL
  Administered 2014-03-02: 2 via ORAL
  Administered 2014-03-02: 1 via ORAL
  Administered 2014-03-03 (×4): 2 via ORAL
  Filled 2014-03-01 (×3): qty 2
  Filled 2014-03-01: qty 1
  Filled 2014-03-01 (×3): qty 2
  Filled 2014-03-01: qty 1
  Filled 2014-03-01: qty 2
  Filled 2014-03-01 (×2): qty 1

## 2014-03-01 MED ORDER — CETYLPYRIDINIUM CHLORIDE 0.05 % MT LIQD
7.0000 mL | Freq: Two times a day (BID) | OROMUCOSAL | Status: DC
  Administered 2014-03-01 – 2014-03-02 (×3): 7 mL via OROMUCOSAL

## 2014-03-01 MED FILL — Fentanyl Citrate Inj 0.05 MG/ML: INTRAMUSCULAR | Qty: 2 | Status: AC

## 2014-03-01 NOTE — Progress Notes (Signed)
ANTICOAGULATION CONSULT NOTE - Follow Up Consult  Pharmacy Consult for Heparin  Indication: pulmonary embolus, s/p 2hr tPA infusion 2/4 ~1900  Allergies  Allergen Reactions  . Mobic [Meloxicam] Swelling    Patient Measurements: Height: 5\' 11"  (180.3 cm) Weight: 229 lb (103.874 kg) IBW/kg (Calculated) : 70.8  Heparin dosing weight: 93 kg  Vital Signs: Temp: 98.8 F (37.1 C) (02/05 2130) Temp Source: Oral (02/05 2130) BP: 126/80 mmHg (02/05 2130) Pulse Rate: 100 (02/05 2130)  Labs:  Recent Labs  02/28/14 1355 02/28/14 2120 02/28/14 2314 03/01/14 0301 03/01/14 0628 03/01/14 1335 03/01/14 2100  HGB 13.3 12.3  --  11.4*  --   --   --   HCT 39.5 36.6  --  34.0*  --   --   --   PLT 210 185  --  191  --   --   --   APTT 37 98* 69*  --   --   --   --   LABPROT 14.3 >90.0*  --   --   --   --   --   INR 1.11 >10.00*  --   --   --   --   --   HEPARINUNFRC  --   --   --   --  0.10* 0.11* 0.37  CREATININE 0.83  --   --   --   --   --   --   TROPONINI 0.20*  --   --   --   --   --   --     Estimated Creatinine Clearance: 126.6 mL/min (by C-G formula based on Cr of 0.83).  Assessment: 34 year old female with massive PE s/p tPA 2/4 ~1900 now on IV heparin. Heparin level is therapeutic on 1700 units/hr. No bleeding noted.  Goal of Therapy:  Heparin level 0.3-0.7 units/mL  Monitor platelets by anticoagulation protocol: Yes   Plan:  Continue heparin at 1700 units/hr.  Daily heparin level and CBC.   Christoper Fabianaron Moustafa Mossa, PharmD, BCPS Clinical pharmacist, pager [phone] 03/01/2014 10:01 PM

## 2014-03-01 NOTE — H&P (Signed)
PULMONARY / CRITICAL CARE MEDICINE   Name: Paula Mack MRN: [id number] DOB: 1980/06/26    ADMISSION DATE:  02/28/2014 CONSULTATION DATE:  02/28/2014  REFERRING MD :  [facility]  CHIEF COMPLAINT:  Shortness of breath  INITIAL PRESENTATION: Presented from [facility] after acute PE found on CTA with associated right heart strain with an RV/LV ratio of 3.0. Patient started on heparin and arrived tachycardic up to 140s, initially on [place] with desat down to 88% and escalated to non-rebreather. Unstable hemodynamically unstable equivalent with rr 40, 100% nrb to receive TPA by IR.  STUDIES:  LE duplex (2/4): nonocclusive thrombus in L fem/popliteal v CTA (2/4): acute saddle PE w/ r. Heart strain MRSA 2/4>> Factor 5 leiden 2/4>> Homocysteine 2/4>> Prothrombin gene mutation 2/4>> Echo  SIGNIFICANT EVENTS: 2/4: patient admitted to unit; received systemic TPA  HISTORY OF PRESENT ILLNESS:  [facility] 3 days of progressive sharp chest pain and shortness of breath. She reports no hemoptysis. Breathing makes her pain worse. She has a history of 3 weeks of low mobility due to back pain from a L5-S1 disc protrusion. She was scheduled for surgery in a week with [facility]. Prior to her immobility, she worked at a job where she was sedentary. She was also using OCPs. No prior history of DVT or PE. No known family history or personal history of clotting disorders.  PAST MEDICAL HISTORY :   has a past medical history of Chronic back pain.  has past surgical history that includes Wisdom tooth extraction. Prior to Admission medications   Medication Sig Start Date End Date Taking? Authorizing Provider  gabapentin (NEURONTIN) 300 MG capsule Take 300 mg by mouth 2 (two) times daily.   Yes Historical Provider, MD  HYDROcodone-acetaminophen (NORCO) 7.5-325 MG per tablet Take 1 tablet by mouth every 6 (six) hours as needed for moderate pain.   Yes  Historical Provider, MD  oxyCODONE-acetaminophen (PERCOCET/ROXICET) 5-325 MG per tablet Take by mouth every 4 (four) hours as needed for severe pain.   Yes Historical Provider, MD  traMADol (ULTRAM) 50 MG tablet Take by mouth every 6 (six) hours as needed.   Yes Historical Provider, MD   Allergies  Allergen Reactions  . Mobic [Meloxicam] Swelling    FAMILY HISTORY:  has no family status information on file.  SOCIAL HISTORY:  reports that she has never smoked. She does not have any smokeless tobacco history on file. She reports that she does not drink alcohol or use illicit drugs.  REVIEW OF SYSTEMS:  Chest pain, shortness of breath  SUBJECTIVE:  Patient reports shortness of breath and chest pain are improved S/p TPA (given at 18:50)  VITAL SIGNS: Temp:  [98.2 F (36.8 C)-98.7 F (37.1 C)] 98.3 F (36.8 C) (02/05 0350) Pulse Rate:  [93-145] 94 (02/05 0605) Resp:  [18-36] 18 (02/05 0605) BP: (95-132)/(62-79) 104/63 mmHg (02/05 0605) SpO2:  [88 %-100 %] 100 % (02/05 0605) Weight:  [229 lb (103.874 kg)] 229 lb (103.874 kg) (02/04 1339) HEMODYNAMICS:   VENTILATOR SETTINGS:    INTAKE / OUTPUT:  Intake/Output Summary (Last 24 hours) at 03/01/14 0855 Last data filed at 03/01/14 0800  Gross per 24 hour  Intake 1940.5 ml  Output   1700 ml  Net  240.5 ml    PHYSICAL EXAMINATION: General:  No distress Neuro:  Alert, oriented, PERRL HEENT:  [place]/AT, non-rebreather fitted Cardiovascular:  Regular rate and regular rhythm, no murmurs heard Lungs: anterior auscultation. Diminished  breath sounds throughout, no crackles Abdomen:  Soft, non-tender, non-distended Musculoskeletal:  No swelling or erythema noted. Moves extremities spontaneously Skin:  No cyanosis, no rashes  LABS:  CBC  Recent Labs Lab 02/28/14 1355 02/28/14 2120 03/01/14 0301  WBC 19.1* 24.3* 18.0*  HGB 13.3 12.3 11.4*  HCT 39.5 36.6 34.0*  PLT 210 185 191   Coag's  Recent Labs Lab 02/28/14 1355  02/28/14 2120 02/28/14 2314  APTT 37 98* 69*  INR 1.11 >10.00*  --    BMET  Recent Labs Lab 02/28/14 1355  NA 134*  K 4.3  CL 102  CO2 24  BUN 14  CREATININE 0.83  GLUCOSE 157*   Electrolytes  Recent Labs Lab 02/28/14 1355  CALCIUM 9.0   Sepsis Markers No results for input(s): LATICACIDVEN, PROCALCITON, O2SATVEN in the last 168 hours. ABG No results for input(s): PHART, PCO2ART, PO2ART in the last 168 hours. Liver Enzymes  Recent Labs Lab 02/28/14 1355  AST 34  ALT 34  ALKPHOS 96  BILITOT 0.2*  ALBUMIN 3.9   Cardiac Enzymes  Recent Labs Lab 02/28/14 1355  TROPONINI 0.20*   Glucose  Recent Labs Lab 02/28/14 1754  GLUCAP 163*    Imaging  Dg Chest 2 View  02/28/2014   CLINICAL DATA:  Right-sided chest pain and shortness of breath for 2 days  EXAM: CHEST  2 VIEW  COMPARISON:  None.  FINDINGS: There is patchy infiltrate in the lateral right base. Elsewhere lungs are clear. Heart size and pulmonary vascularity are within normal limits. No adenopathy. No bone lesions.  IMPRESSION: Lateral right base infiltrate.   Electronically Signed   By: Bretta Bang M.D.   On: 02/28/2014 14:46   Ct Angio Chest Pe W/cm &/or Wo Cm  02/28/2014   CLINICAL DATA:  Shortness of breath and chest pain for 1 day  EXAM: CT ANGIOGRAPHY CHEST WITH CONTRAST  TECHNIQUE: Multidetector CT imaging of the chest was performed using the standard protocol during bolus administration of intravenous contrast. Multiplanar CT image reconstructions and MIPs were obtained to evaluate the vascular anatomy.  CONTRAST:  OMNIPAQUE IOHEXOL 350 MG/ML SOLN  COMPARISON:  Chest radiograph February 28, 2014  FINDINGS: There is extensive pulmonary emboli arising from both main pulmonary arteries with extension of pulmonary emboli into major branches of both upper and lower lobe pulmonary arteries. There is a small amount of thrombus in the distal main pulmonary outflow tract. There is right heart strain  with a right ventricle to left ventricular diameter of approximately 3, markedly abnormal.  There is no appreciable thoracic aortic aneurysm or dissection.  There is airspace consolidation in the anterior and lateral segments of the right lower lobe. A smaller amount of infiltrate is present in the posterior segment right lower lobe. Elsewhere, the lungs are clear.  There is no appreciable thoracic adenopathy. Mild residual thymic tissue may be within normal limits for age. The pericardium is not thickened.  In the visualized upper abdomen, no lesion is seen. There are no blastic or lytic bone lesions. Thyroid appears normal.  Review of the MIP images confirms the above findings.  IMPRESSION: Positive for acute PE with CTevidence of right heart strain (RV/LV Ratio = 3.0) consistent with at least submassive (intermediate risk) PE. The presence of right heart strain has been associated with an increased risk of morbidity and mortality. Pulmonary emboli extending from the distal main pulmonary outflow tract through both main pulmonary arteries and into multiple branches of the lower and  upper lobe pulmonary arteries bilaterally.  Right lower lobe airspace consolidation.  No adenopathy.  Critical Value/emergent results were called by telephone at the time of interpretation on 02/28/2014 at 3:35 pm to Dr. Linwood Dibbles , who verbally acknowledged these results.   Electronically Signed   By: Bretta Bang M.D.   On: 02/28/2014 15:36   US Venous Img Lower Unilateral Left  02/28/2014   CLINICAL DATA:  Left leg swelling below the knee for 3 months. Shortness of breath for 2 days.  EXAM: LEFT LOWER EXTREMITY VENOUS DOPPLER ULTRASOUND  TECHNIQUE: Gray-scale sonography with graded compression, as well as color Doppler and duplex ultrasound, were performed to evaluate the deep venous system from the level of the common femoral vein through the popliteal and proximal calf veins. Spectral Doppler was utilized to evaluate flow at  rest and with distal augmentation maneuvers.  COMPARISON:  None.  FINDINGS: The right common femoral vein is patent.  Normal compressibility, color Doppler flow and phasicity in the left common femoral vein. The left saphenofemoral junction is patent. Left profunda femoral vein is patent. Partial compressibility of the left femoral vein with some color Doppler flow. 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 Partial compressibility of the left popliteal vein with some flow. Findings are compatible with nonocclusive thrombus in the left femoral vein and left popliteal vein.  Images of the left calf are limited. According to the sonographer, the peroneal veins in the mid calf did not compress and at least 1 posterior tibial vein in the mid calf did not compress.  Visualized left great saphenous vein is patent.  IMPRESSION: Positive for deep vein thrombosis in the left lower extremity. There is nonocclusive thrombus in the left femoral vein and left popliteal vein. Probable deep vein thrombosis involving left calf veins which are not well imaged.   Electronically Signed   By: Richarda Overlie M.D.   On: 02/28/2014 15:21    ASSESSMENT / PLAN:  PULMONARY A: Massive acute saddle PE. Spoke with physician from [place] who confirmed no risk with using TPA with patients current L5-S1 bulging disc Respiratory distress, tachypnea P:   Oxygen to keep O2 sat >94% Heparin See heme IS  CARDIOVASCULAR A:  Right heart strain w/ RV/LV ratio of 3.0, Occult RV failure on my echo Sinus tachycardia secondary to PE Elevated troponin 0.2, rv failure related R/o LVH  R./o any prior pa htn (unlikely with acute presentation ) P:  Manage as above Monitor blood pressures. MAP goal >65 Follow-up echo Volume to some extent  RENAL A:   Hyponatremia  P:   Monitor metabolic panel IVF NS@125ml /hr  GASTROINTESTINAL A:  No acute issues P:  Regular diet NS @  75cc/h  HEMATOLOGIC A:  Leukocytosis, risk ICH DVT P:  Monitor CBC Therapeutic heparin Neurochecks, ensure blood type in case cryo needed if neuro change limit blood draws No invasive procedures before 24 hrs post TPA if it can be helped  INFECTIOUS A: No acute issues P:   MRSA PCR 2/4>>  ENDOCRINE A:  No acute issues     NEUROLOGIC A:  L5-S1 disc protrusion Low risk ICH P:   RASS goal: 0 Fentanyl 25-50mcg q1hrs PRN neurochecks   FAMILY  - Updates: Family updated of the plan  - Inter-disciplinary family meet or Palliative Care meeting due by:  03/07/2014    TODAY'S SUMMARY: Patient with initial unstable vitals now improved and stable s/p TPA; currently on heparin.  Jacquelin Hawking, MD PGY-2, [facility] 03/01/2014, 7:11 AM   Attending Note:  I have examined patient, reviewed labs, studies and notes. I have discussed the case with Dr Caleb Popp, and I agree with the data and plans as amended above. She is s/p large PE and is now clinically improved after systemic lysis. She is on heparin gtt, will need to consider and start on oral anticoagulation. Will discuss with her the different options. She can transfer to telemetry bed on heparin gtt 2/5.    Levy Pupa, MD, PhD 03/01/2014, 10:08 AM [facility] [phone] or if no answer [phone]

## 2014-03-01 NOTE — Progress Notes (Signed)
  Echocardiogram 2D Echocardiogram has been performed.  Leta JunglingCooper, Torrian Canion M 03/01/2014, 9:57 AM

## 2014-03-01 NOTE — Progress Notes (Addendum)
ANTICOAGULATION CONSULT NOTE - Follow Up Consult  Pharmacy Consult for Heparin  Indication: pulmonary embolus, s/p 2hr tPA infusion   Allergies  Allergen Reactions  . Mobic [Meloxicam] Swelling    Patient Measurements: Height: 5\' 11"  (180.3 cm) Weight: 229 lb (103.874 kg) IBW/kg (Calculated) : 70.8  Heparin dosing weight: 93 kg  Vital Signs: Temp: 98.7 F (37.1 C) (02/05 1100) Temp Source: Oral (02/05 1100) BP: 106/60 mmHg (02/05 1200) Pulse Rate: 103 (02/05 1400)  Labs:  Recent Labs  02/28/14 1355 02/28/14 2120 02/28/14 2314 03/01/14 0301 03/01/14 0628 03/01/14 1335  HGB 13.3 12.3  --  11.4*  --   --   HCT 39.5 36.6  --  34.0*  --   --   PLT 210 185  --  191  --   --   APTT 37 98* 69*  --   --   --   LABPROT 14.3 >90.0*  --   --   --   --   INR 1.11 >10.00*  --   --   --   --   HEPARINUNFRC  --   --   --   --  0.10* 0.11*  CREATININE 0.83  --   --   --   --   --   TROPONINI 0.20*  --   --   --   --   --     Estimated Creatinine Clearance: 126.6 mL/min (by C-G formula based on Cr of 0.83).  Assessment: 34 year old female with massive PE s/p tPA now on IV heparin.  Heparin level is sub-therapeutic on 1350 units/hr. Improved respiratory status. H/H trending down s/p tPA. Platelets 191.  Scant hemoptysis reported this am but no further episodes.  No other signs of symptoms of bleeding.  Goal of Therapy:  Heparin level 0.3-0.5 units/mL (s/p tPA) Monitor platelets by anticoagulation protocol: Yes   Plan:  Increase heparin to 1700 units/hr.  Heparin level in 6 hours.  Daily heparin level and CBC.

## 2014-03-02 ENCOUNTER — Inpatient Hospital Stay (HOSPITAL_COMMUNITY): Payer: BLUE CROSS/BLUE SHIELD

## 2014-03-02 LAB — URINALYSIS, ROUTINE W REFLEX MICROSCOPIC
Bilirubin Urine: NEGATIVE
Glucose, UA: NEGATIVE mg/dL
Ketones, ur: NEGATIVE mg/dL
Nitrite: NEGATIVE
PROTEIN: NEGATIVE mg/dL
Specific Gravity, Urine: 1.015 (ref 1.005–1.030)
Urobilinogen, UA: 0.2 mg/dL (ref 0.0–1.0)
pH: 6.5 (ref 5.0–8.0)

## 2014-03-02 LAB — HEPARIN LEVEL (UNFRACTIONATED): Heparin Unfractionated: 0.38 IU/mL (ref 0.30–0.70)

## 2014-03-02 LAB — URINE MICROSCOPIC-ADD ON

## 2014-03-02 LAB — CBC
HEMATOCRIT: 36.1 % (ref 36.0–46.0)
Hemoglobin: 11.7 g/dL — ABNORMAL LOW (ref 12.0–15.0)
MCH: 28.1 pg (ref 26.0–34.0)
MCHC: 32.4 g/dL (ref 30.0–36.0)
MCV: 86.8 fL (ref 78.0–100.0)
Platelets: 300 10*3/uL (ref 150–400)
RBC: 4.16 MIL/uL (ref 3.87–5.11)
RDW: 14.7 % (ref 11.5–15.5)
WBC: 13.8 10*3/uL — AB (ref 4.0–10.5)

## 2014-03-02 MED ORDER — FLUTICASONE PROPIONATE 50 MCG/ACT NA SUSP
1.0000 | Freq: Every day | NASAL | Status: DC
  Filled 2014-03-02: qty 16

## 2014-03-02 MED ORDER — VALACYCLOVIR HCL 500 MG PO TABS
500.0000 mg | ORAL_TABLET | Freq: Every day | ORAL | Status: DC
  Administered 2014-03-02 – 2014-03-03 (×2): 500 mg via ORAL
  Filled 2014-03-02 (×2): qty 1

## 2014-03-02 MED ORDER — RIVAROXABAN 20 MG PO TABS: 20.0000 mg | ORAL_TABLET | Freq: Every day | ORAL | Status: DC

## 2014-03-02 MED ORDER — RIVAROXABAN 15 MG PO TABS
15.0000 mg | ORAL_TABLET | Freq: Two times a day (BID) | ORAL | Status: DC
  Administered 2014-03-02 – 2014-03-03 (×2): 15 mg via ORAL
  Filled 2014-03-02 (×4): qty 1

## 2014-03-02 MED ORDER — LORATADINE 10 MG PO TABS
10.0000 mg | ORAL_TABLET | Freq: Every day | ORAL | Status: DC
  Administered 2014-03-03: 10 mg via ORAL
  Filled 2014-03-02 (×2): qty 1

## 2014-03-02 MED ORDER — HAIR/SKIN/NAILS PO TABS: 1.0000 | ORAL_TABLET | Freq: Every day | ORAL | Status: DC

## 2014-03-02 NOTE — Discharge Instructions (Signed)
Information on my medicine - XARELTO (rivaroxaban)  This medication education was reviewed with me or my healthcare representative as part of my discharge preparation.  The pharmacist that spoke with me during my hospital stay was:  Enoch Moffa, Gwenlyn FoundJessica C, [facility]  WHY WAS XARELTO PRESCRIBED FOR YOU? Xarelto was prescribed to treat blood clots that may have been found in the veins of your legs (deep vein thrombosis) or in your lungs (pulmonary embolism) and to reduce the risk of them occurring again.  What do you need to know about Xarelto? The starting dose is one 15 mg tablet taken TWICE daily with food for the FIRST 21 DAYS then on (enter date) 03/23/14 the dose is changed to one 20 mg tablet taken ONCE A DAY with your evening meal.  DO NOT stop taking Xarelto without talking to the health care provider who prescribed the medication.  Refill your prescription for 20 mg tablets before you run out.  After discharge, you should have regular check-up appointments with your healthcare provider that is prescribing your Xarelto.  In the future your dose may need to be changed if your kidney function changes by a significant amount.  What do you do if you miss a dose? If you are taking Xarelto TWICE DAILY and you miss a dose, take it as soon as you remember. You may take two 15 mg tablets (total 30 mg) at the same time then resume your regularly scheduled 15 mg twice daily the next day.  If you are taking Xarelto ONCE DAILY and you miss a dose, take it as soon as you remember on the same day then continue your regularly scheduled once daily regimen the next day. Do not take two doses of Xarelto at the same time.   Important Safety Information Xarelto is a blood thinner medicine that can cause bleeding. You should call your healthcare provider right away if you experience any of the following: ? Bleeding from an injury or your nose that does not stop. ? Unusual colored urine (red or dark brown) or  unusual colored stools (red or black). ? Unusual bruising for unknown reasons. ? A serious fall or if you hit your head (even if there is no bleeding).  Some medicines may interact with Xarelto and might increase your risk of bleeding while on Xarelto. To help avoid this, consult your healthcare provider or pharmacist prior to using any new prescription or non-prescription medications, including herbals, vitamins, non-steroidal anti-inflammatory drugs (NSAIDs) and supplements.  This website has more information on Xarelto: [email or web address].xarelto.[email or web address].

## 2014-03-02 NOTE — Progress Notes (Signed)
Patient Demographics  Paula Mack, is a 34 y.o. female, DOB - 03/09/1980, ZOX:[id number]  Admit date - 02/28/2014   Admitting Physician Nelda Bucks, MD  Outpatient Primary MD for the patient is PROVIDER NOT IN SYSTEM  LOS - 2   Chief Complaint  Patient presents with  . Shortness of Breath      Admission history of present illness/brief narrative: Patient presented to [facility] after 3 days of progressive sharp chest pain and shortness of breath. She reports no hemoptysis. Breathing makes her pain worse. She has a history of 3 weeks of low mobility due to back pain from a L5-S1 disc protrusion. She was scheduled for surgery in a week with [facility]. Prior to her immobility, she worked at a job where she was sedentary. She was also using OCPs. No prior history of DVT or PE. No known family history or personal history of clotting disorders. PE found on CTA with associated right heart strain with an RV/LV ratio of 3.0. Patient started on heparin , was tachycardic up to 140s, initially on [place] with desat down to 88% and escalated to non-rebreather. Unstable hemodynamically unstable equivalent with rr 40, 100% nrb to receive TPA by IR on 2/4, patient's vital signs stable, oxygen, repeat echo 2/5, showing normal LV function, the right heart appears to be normal in size, with possible mild reduction in function, downgraded to telemetry on 2/5. Venous Doppler were positive for DVT in left lower extremity. Subjective:   Cornie Mccomber today has, No headache, No chest pain, No abdominal pain - No Nausea, No new weakness tingling or numbness, No Cough - SOB.   Assessment & Plan    Active Problems:   PE (pulmonary thromboembolism)   Leg swelling  Massive acute saddle PE/left lower extremity PE: - Status post IV TPA by IR - Heartrate much improved, no hypoxia,  on room air. - Continue on heparin drip, will discuss with pulmonary transitioning to oral anticoagulation. - Risk factors include sedentary job, recent low mobility given her lower back pain,  on oral contraception. - Hypercoagulable workup was sent, results still pending.  Lumbar radiculopathy - Continue to follow with neurosurgery as an outpatient - Pain medicine as needed  Leukocytosis -Most likely stress related, continues to improve, will check urine analysis  Elevated troponin - Initially on admission, and this is secondary to her pulmonary embolism.      Code Status: Full  Family Communication: Mother at bedside  Disposition Plan: Home when stable   Procedures  IV TPA by IR   Consults   Admitted by pulmonary critical care   Medications  Scheduled Meds: . antiseptic oral rinse  7 mL Mouth Rinse BID   Continuous Infusions: . sodium chloride 10 mL/hr at 03/01/14 1013  . heparin 1,700 Units/hr (03/02/14 0656)   PRN Meds:.oxyCODONE-acetaminophen  DVT Prophylaxis on heparin drip  Lab Results  Component Value Date   PLT 300 03/02/2014    Antibiotics    Anti-infectives    None          Objective:   Filed Vitals:   03/01/14 1530 03/01/14 1600 03/01/14 1754 03/01/14 2130  BP: 120/55  121/55 126/80  Pulse: 99 102 106 100  Temp:  98.8 F (37.1 C)  TempSrc:    Oral  Resp: Height:      Weight:      SpO2:  100%  97%    Wt Readings from Last 3 Encounters:  02/28/14 103.874 kg (229 lb)     Intake/Output Summary (Last 24 hours) at 03/02/14 1249 Last data filed at 03/02/14 0900  Gross per 24 hour  Intake    425 ml  Output    350 ml  Net     75 ml     Physical Exam  Awake Alert, Oriented X 3, No new F.N deficits, Normal affect [place].AT,PERRAL Supple Neck,No JVD, No cervical lymphadenopathy appriciated.  Symmetrical Chest wall movement, Good air movement bilaterally, CTAB RRR,No Gallops,Rubs or new Murmurs, No Parasternal  Heave +ve B.Sounds, Abd Soft, No tenderness, No organomegaly appriciated, No rebound - guarding or rigidity. No Cyanosis, Clubbing or edema, No new Rash or bruise     Data Review   Micro Results Recent Results (from the past 240 hour(s))  MRSA PCR Screening     Status: None   Collection Time: 02/28/14  7:30 PM  Result Value Ref Range Status   MRSA by PCR NEGATIVE NEGATIVE Final    Comment:        The GeneXpert MRSA Assay (FDA approved for NASAL specimens only), is one component of a comprehensive MRSA colonization surveillance program. It is not intended to diagnose MRSA infection nor to guide or monitor treatment for MRSA infections.     Radiology Reports Dg Chest 2 View  02/28/2014   CLINICAL DATA:  Right-sided chest pain and shortness of breath for 2 days  EXAM: CHEST  2 VIEW  COMPARISON:  None.  FINDINGS: There is patchy infiltrate in the lateral right base. Elsewhere lungs are clear. Heart size and pulmonary vascularity are within normal limits. No adenopathy. No bone lesions.  IMPRESSION: Lateral right base infiltrate.   Electronically Signed   By: Bretta Bang M.D.   On: 02/28/2014 14:46   Ct Angio Chest Pe W/cm &/or Wo Cm  02/28/2014   CLINICAL DATA:  Shortness of breath and chest pain for 1 day  EXAM: CT ANGIOGRAPHY CHEST WITH CONTRAST  TECHNIQUE: Multidetector CT imaging of the chest was performed using the standard protocol during bolus administration of intravenous contrast. Multiplanar CT image reconstructions and MIPs were obtained to evaluate the vascular anatomy.  CONTRAST:  OMNIPAQUE IOHEXOL 350 MG/ML SOLN  COMPARISON:  Chest radiograph February 28, 2014  FINDINGS: There is extensive pulmonary emboli arising from both main pulmonary arteries with extension of pulmonary emboli into major branches of both upper and lower lobe pulmonary arteries. There is a small amount of thrombus in the distal main pulmonary outflow tract. There is right heart strain with a  right ventricle to left ventricular diameter of approximately 3, markedly abnormal.  There is no appreciable thoracic aortic aneurysm or dissection.  There is airspace consolidation in the anterior and lateral segments of the right lower lobe. A smaller amount of infiltrate is present in the posterior segment right lower lobe. Elsewhere, the lungs are clear.  There is no appreciable thoracic adenopathy. Mild residual thymic tissue may be within normal limits for age. The pericardium is not thickened.  In the visualized upper abdomen, no lesion is seen. There are no blastic or lytic bone lesions. Thyroid appears normal.  Review of the MIP images confirms the above findings.  IMPRESSION: Positive for acute PE with  CTevidence of right heart strain (RV/LV Ratio = 3.0) consistent with at least submassive (intermediate risk) PE. The presence of right heart strain has been associated with an increased risk of morbidity and mortality. Pulmonary emboli extending from the distal main pulmonary outflow tract through both main pulmonary arteries and into multiple branches of the lower and upper lobe pulmonary arteries bilaterally.  Right lower lobe airspace consolidation.  No adenopathy.  Critical Value/emergent results were called by telephone at the time of interpretation on 02/28/2014 at 3:35 pm to Dr. Linwood Dibbles , who verbally acknowledged these results.   Electronically Signed   By: Bretta Bang M.D.   On: 02/28/2014 15:36   US Venous Img Lower Unilateral Left  02/28/2014   CLINICAL DATA:  Left leg swelling below the knee for 3 months. Shortness of breath for 2 days.  EXAM: LEFT LOWER EXTREMITY VENOUS DOPPLER ULTRASOUND  TECHNIQUE: Gray-scale sonography with graded compression, as well as color Doppler and duplex ultrasound, were performed to evaluate the deep venous system from the level of the common femoral vein through the popliteal and proximal calf veins. Spectral Doppler was utilized to evaluate flow at rest and  with distal augmentation maneuvers.  COMPARISON:  None.  FINDINGS: The right common femoral vein is patent.  Normal compressibility, color Doppler flow and phasicity in the left common femoral vein. The left saphenofemoral junction is patent. Left profunda femoral vein is patent. Partial compressibility of the left femoral vein with some color Doppler flow. 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 Partial compressibility of the left popliteal vein with some flow. Findings are compatible with nonocclusive thrombus in the left femoral vein and left popliteal vein.  Images of the left calf are limited. According to the sonographer, the peroneal veins in the mid calf did not compress and at least 1 posterior tibial vein in the mid calf did not compress.  Visualized left great saphenous vein is patent.  IMPRESSION: Positive for deep vein thrombosis in the left lower extremity. There is nonocclusive thrombus in the left femoral vein and left popliteal vein. Probable deep vein thrombosis involving left calf veins which are not well imaged.   Electronically Signed   By: Richarda Overlie M.D.   On: 02/28/2014 15:21    CBC  Recent Labs Lab 02/28/14 1355 02/28/14 2120 03/01/14 0301 03/02/14 0335  WBC 19.1* 24.3* 18.0* 13.8*  HGB 13.3 12.3 11.4* 11.7*  HCT 39.5 36.6 34.0* 36.1  PLT 210 185 191 300  MCV 83.9 83.4 83.5 86.8  MCH 28.2 28.0 28.0 28.1  MCHC 33.7 33.6 33.5 32.4  RDW 14.4 14.3 14.2 14.7    Chemistries   Recent Labs Lab 02/28/14 1355  NA 134*  K 4.3  CL 102  CO2 24  GLUCOSE 157*  BUN 14  CREATININE 0.83  CALCIUM 9.0  AST 34  ALT 34  ALKPHOS 96  BILITOT 0.2*   ------------------------------------------------------------------------------------------------------------------ estimated creatinine clearance is 126.6 mL/min (by C-G formula based on Cr of  0.83). ------------------------------------------------------------------------------------------------------------------ No results for input(s): HGBA1C in the last 72 hours. ------------------------------------------------------------------------------------------------------------------ No results for input(s): CHOL, HDL, LDLCALC, TRIG, CHOLHDL, LDLDIRECT in the last 72 hours. ------------------------------------------------------------------------------------------------------------------ No results for input(s): TSH, T4TOTAL, T3FREE, THYROIDAB in the last 72 hours.  Invalid input(s): FREET3 ------------------------------------------------------------------------------------------------------------------ No results for input(s): VITAMINB12, FOLATE, FERRITIN, TIBC, IRON, RETICCTPCT in the last 72 hours.  Coagulation profile  Recent Labs Lab 02/28/14 1355 02/28/14 2120  INR 1.11 >10.00*  No results for input(s): DDIMER in the last 72 hours.  Cardiac Enzymes  Recent Labs Lab 02/28/14 1355  TROPONINI 0.20*   ------------------------------------------------------------------------------------------------------------------ Invalid input(s): POCBNP     Time Spent in minutes   35 minutes   ELGERGAWY, DAWOOD M.D on 03/02/2014 at 12:49 PM  Between 7am to 7pm - Pager - [phone]  After 7pm go to [email or web address] - password [email or web address]  And look for the night coverage person covering for me after hours  [facility] Office  [phone]   **Disclaimer: This note may have been dictated with voice recognition software. Similar sounding words can inadvertently be transcribed and this note may contain transcription errors which may not have been corrected upon publication of note.**

## 2014-03-02 NOTE — Progress Notes (Addendum)
ANTICOAGULATION CONSULT NOTE - Follow Up Consult  Pharmacy Consult for Heparin > convert to Xarelto Indication: pulmonary embolus, s/p 2hr tPA infusion 2/4 ~1900  Allergies  Allergen Reactions  . Mobic [Meloxicam] Swelling    Patient Measurements: Height: 5\' 11"  (180.3 cm) Weight: 229 lb (103.874 kg) IBW/kg (Calculated) : 70.8  Heparin dosing weight: 93 kg  Vital Signs:    Labs:  Recent Labs  02/28/14 1355 02/28/14 2120 02/28/14 2314 03/01/14 0301  03/01/14 1335 03/01/14 2100 03/02/14 0335  HGB 13.3 12.3  --  11.4*  --   --   --  11.7*  HCT 39.5 36.6  --  34.0*  --   --   --  36.1  PLT 210 185  --  191  --   --   --  300  APTT 37 98* 69*  --   --   --   --   --   LABPROT 14.3 >90.0*  --   --   --   --   --   --   INR 1.11 >10.00*  --   --   --   --   --   --   HEPARINUNFRC  --   --   --   --   < > 0.11* 0.37 0.38  CREATININE 0.83  --   --   --   --   --   --   --   TROPONINI 0.20*  --   --   --   --   --   --   --   < > = values in this interval not displayed.  Estimated Creatinine Clearance: 126.6 mL/min (by C-G formula based on Cr of 0.83).  Assessment: 34 year old female with massive PE s/p tPA 2/4 ~1900 now on IV heparin. Heparin level is therapeutic on 1700 units/hr. No bleeding noted.  Spoke to Dr. Randol KernElgergawy - plan to change to Xarelto today.    Goal of Therapy:  Heparin level 0.3-0.7 units/mL  Monitor platelets by anticoagulation protocol: Yes   Plan:  Continue heparin at 1700 units/hr.  D/c IV heparin at 1630 today, and start Xarelto 15 mg BID x 21 days.  Then change to Xarelto 20 mg daily. Will complete patient education prior to discharge.  Tad MooreJessica Amreen Raczkowski, Pharm D, BCPS  Clinical Pharmacist Pager [phone]  03/02/2014 11:03 AM

## 2014-03-03 LAB — CBC
HCT: 33.7 % — ABNORMAL LOW (ref 36.0–46.0)
HEMOGLOBIN: 11.2 g/dL — AB (ref 12.0–15.0)
MCH: 28 pg (ref 26.0–34.0)
MCHC: 33.2 g/dL (ref 30.0–36.0)
MCV: 84.3 fL (ref 78.0–100.0)
Platelets: 363 10*3/uL (ref 150–400)
RBC: 4 MIL/uL (ref 3.87–5.11)
RDW: 14.1 % (ref 11.5–15.5)
WBC: 10.9 10*3/uL — AB (ref 4.0–10.5)

## 2014-03-03 LAB — BASIC METABOLIC PANEL
Anion gap: 9 (ref 5–15)
BUN: 9 mg/dL (ref 6–23)
CO2: 24 mmol/L (ref 19–32)
Calcium: 9.1 mg/dL (ref 8.4–10.5)
Chloride: 104 mmol/L (ref 96–112)
Creatinine, Ser: 0.72 mg/dL (ref 0.50–1.10)
GFR calc Af Amer: >90 mL/min (ref >90)
GFR calc non Af Amer: >90 mL/min (ref >90)
Glucose, Bld: 91 mg/dL (ref 70–99)
Potassium: 4 mmol/L (ref 3.5–5.1)
Sodium: 137 mmol/L (ref 135–145)

## 2014-03-03 LAB — HOMOCYSTEINE: HOMOCYSTEINE-NORM: 6.2 umol/L (ref 0.0–15.0)

## 2014-03-03 MED ORDER — OXYCODONE-ACETAMINOPHEN 5-325 MG PO TABS: 1.0000 | ORAL_TABLET | ORAL | Status: DC | PRN

## 2014-03-03 MED ORDER — OXYCODONE HCL 5 MG PO TABS
10.0000 mg | ORAL_TABLET | Freq: Once | ORAL | Status: AC
  Administered 2014-03-03: 10 mg via ORAL
  Filled 2014-03-03: qty 2

## 2014-03-03 MED ORDER — RIVAROXABAN (XARELTO) VTE STARTER PACK (15 & 20 MG): ORAL_TABLET | ORAL | Status: DC

## 2014-03-03 NOTE — Care Management Note (Signed)
    Page 1 of 1   03/03/2014     12:30:07 PM CARE MANAGEMENT NOTE 03/03/2014  Patient:  Paula Mack, Paula Mack   Account Number:  [id number]  Date Initiated:  03/01/2014  Documentation initiated by:  Luz Lex  Subjective/Objective Assessment:   Admitted with PE - recieved Tpa     Action/Plan:   discharge planning   Anticipated DC Date:  03/03/2014   Anticipated DC Plan:  [place]  CM consult  Medication Assistance      Choice offered to / List presented to:             Status of service:  Completed, signed off Medicare Important Message given?   (If response is "NO", the following Medicare IM given date fields will be blank) Date Medicare IM given:   Medicare IM given by:   Date Additional Medicare IM given:   Additional Medicare IM given by:    Discharge Disposition:  HOME/SELF CARE  Per UR Regulation:  Reviewed for med. necessity/level of care/duration of stay  If discussed at [place] of Stay Meetings, dates discussed:    Comments:  03/03/13 12;25 Cm met with pt in room to give pt 30-day free trial card for Xarelto.  Pt verbalized this 30 day period of time will give her enough time to go to her follow up appt and ask the office staff to please ensure her medication is authorized by her insurance.  She can then use the $0 co pay card (also given to pt).  No other CM needs were communicated.  Christella Scheuermann, BSN, [place].  Contact:  Enis Slipper [phone]

## 2014-03-03 NOTE — Discharge Summary (Addendum)
Paula Mack, 34 y.o., DOB October 26, 1980, MRN [id number]. Admission date: 02/28/2014 Discharge Date 03/03/2014 Primary MD PROVIDER NOT IN SYSTEM Admitting Physician Nelda Bucks, MD   PCP please follow-up on: - Check CBC, BMP during next visit, as patient has been started on Xarelto. - Please follow on hypercoagulable workup sent during hospital stay at [facility], results are still pending at time of discharge, this can be reviewed at care everywhere through Epic, and if workup is positive patient will need hematology follow-up.  Admission Diagnosis  Leg swelling [M79.89] PE (pulmonary thromboembolism) [I26.99]  Discharge Diagnosis   Active Problems:   PE (pulmonary thromboembolism)   Leg swelling      Past Medical History  Diagnosis Date  . Chronic back pain     Past Surgical History  Procedure Laterality Date  . Wisdom tooth extraction       Hospital Course See H&P, Labs, Consult and Test reports for all details in brief, patient was admitted for **  Active Problems:   PE (pulmonary thromboembolism)   Leg swelling   Admission history of present illness/brief narrative: Patient presented to [facility] after 3 days of progressive sharp chest pain and shortness of breath. She reports no hemoptysis. Breathing makes her pain worse. She has a history of 3 weeks of low mobility due to back pain from a L5-S1 disc protrusion. She was scheduled for surgery in a week with [facility]. Prior to her immobility, she worked at a job where she was sedentary. She was also using OCPs. No prior history of DVT or PE. No known family history or personal history of clotting disorders. PE found on CTA with associated right heart strain with an RV/LV ratio of 3.0. Patient started on heparin , was tachycardic up to 140s, initially on [place] with desat down to 88% and escalated to non-rebreather. Unstable hemodynamically unstable  with rr 40, 100% nrb to receive TPA by IR on 2/4,  patient's vital signs stable, oxygen, repeat echo 2/5, showing normal LV function, the right heart appears to be normal in size, with possible mild reduction in function, downgraded to telemetry on 2/5. Venous Doppler were positive for DVT in left lower extremity. The patient was started on heparin drip, transitioned to oral Xarelto.  Massive acute saddle PE/left lower extremity PE: - Status post IV TPA by IR - Tachycardia and hypoxia has resolved, currently on room air. - Risk factors include sedentary job, recent low mobility given her lower back pain, on oral contraception. - Hypercoagulable workup was sent, results still pending, recommended PCP to follow on the results as an outpatient, if positive will need hematology follow-up . - Will be discharged on Xarelto -Venous Doppler is positive for left lower extremity DVT  Lumbar radiculopathy - Continue to follow with neurosurgery as an outpatient - Pain medicine as needed  Leukocytosis -Most likely stress related, resolved, negative urinalysis.  Elevated troponin - Initially on admission, and this is secondary to her pulmonary embolism.     Consults   Admitted initially to pulmonary critical care service  Significant Tests:  See full reports for all details    Dg Chest 2 View  02/28/2014   CLINICAL DATA:  Right-sided chest pain and shortness of breath for 2 days  EXAM: CHEST  2 VIEW  COMPARISON:  None.  FINDINGS: There is patchy infiltrate in the lateral right base. Elsewhere lungs are clear. Heart size and pulmonary vascularity are within normal limits. No adenopathy. No bone lesions.  IMPRESSION: Lateral right base infiltrate.   Electronically Signed   By: Bretta BangWilliam  Woodruff M.D.   On: 02/28/2014 14:46   Ct Head Wo Contrast  03/02/2014   CLINICAL DATA:  Frontal headache, currently on anti coagulation for pulmonary embolism  EXAM: CT HEAD WITHOUT CONTRAST  TECHNIQUE: Contiguous axial images were obtained from the base of the skull  through the vertex without intravenous contrast.  COMPARISON:  None.  FINDINGS: Streak artifact from bearings is noted. No acute hemorrhage, infarct, or mass lesion is identified. No midline shift. Ventricles are normal in size. Orbits and paranasal sinuses are unremarkable. No skull fracture.  IMPRESSION: Normal head CT.   Electronically Signed   By: Christiana PellantGretchen  Green M.D.   On: 03/02/2014 17:31   Ct Angio Chest Pe W/cm &/or Wo Cm  02/28/2014   CLINICAL DATA:  Shortness of breath and chest pain for 1 day  EXAM: CT ANGIOGRAPHY CHEST WITH CONTRAST  TECHNIQUE: Multidetector CT imaging of the chest was performed using the standard protocol during bolus administration of intravenous contrast. Multiplanar CT image reconstructions and MIPs were obtained to evaluate the vascular anatomy.  CONTRAST:  100mL OMNIPAQUE IOHEXOL 350 MG/ML SOLN  COMPARISON:  Chest radiograph February 28, 2014  FINDINGS: There is extensive pulmonary emboli arising from both main pulmonary arteries with extension of pulmonary emboli into major branches of both upper and lower lobe pulmonary arteries. There is a small amount of thrombus in the distal main pulmonary outflow tract. There is right heart strain with a right ventricle to left ventricular diameter of approximately 3, markedly abnormal.  There is no appreciable thoracic aortic aneurysm or dissection.  There is airspace consolidation in the anterior and lateral segments of the right lower lobe. A smaller amount of infiltrate is present in the posterior segment right lower lobe. Elsewhere, the lungs are clear.  There is no appreciable thoracic adenopathy. Mild residual thymic tissue may be within normal limits for age. The pericardium is not thickened.  In the visualized upper abdomen, no lesion is seen. There are no blastic or lytic bone lesions. Thyroid appears normal.  Review of the MIP images confirms the above findings.  IMPRESSION: Positive for acute PE with CTevidence of right heart  strain (RV/LV Ratio = 3.0) consistent with at least submassive (intermediate risk) PE. The presence of right heart strain has been associated with an increased risk of morbidity and mortality. Pulmonary emboli extending from the distal main pulmonary outflow tract through both main pulmonary arteries and into multiple branches of the lower and upper lobe pulmonary arteries bilaterally.  Right lower lobe airspace consolidation.  No adenopathy.  Critical Value/emergent results were called by telephone at the time of interpretation on 02/28/2014 at 3:35 pm to Dr. Linwood DibblesJON KNAPP , who verbally acknowledged these results.   Electronically Signed   By: Bretta BangWilliam  Woodruff M.D.   On: 02/28/2014 15:36   [place] Venous Img Lower Unilateral Left  02/28/2014   CLINICAL DATA:  Left leg swelling below the knee for 3 months. Shortness of breath for 2 days.  EXAM: LEFT LOWER EXTREMITY VENOUS DOPPLER ULTRASOUND  TECHNIQUE: Gray-scale sonography with graded compression, as well as color Doppler and duplex ultrasound, were performed to evaluate the deep venous system from the level of the common femoral vein through the popliteal and proximal calf veins. Spectral Doppler was utilized to evaluate flow at rest and with distal augmentation maneuvers.  COMPARISON:  None.  FINDINGS: The right common femoral vein is patent.  Normal compressibility,  color Doppler flow and phasicity in the left common femoral vein. The left saphenofemoral junction is patent. Left profunda femoral vein is patent. Partial compressibility of the left femoral vein with some color Doppler flow. 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 Partial compressibility of the left popliteal vein with some flow. Findings are compatible with nonocclusive thrombus in the left femoral vein and left popliteal vein.  Images of the left calf are limited. According to the sonographer, the peroneal veins in the mid calf did not compress and at least 1  posterior tibial vein in the mid calf did not compress.  Visualized left great saphenous vein is patent.  IMPRESSION: Positive for deep vein thrombosis in the left lower extremity. There is nonocclusive thrombus in the left femoral vein and left popliteal vein. Probable deep vein thrombosis involving left calf veins which are not well imaged.   Electronically Signed   By: Richarda Overlie M.D.   On: 02/28/2014 15:21     Today   Subjective:   Paula Mack today has no headache,no chest abdominal pain,no new weakness tingling or numbness, feels much better wants to go home today.   Objective:   Blood pressure 114/71, pulse 91, temperature 98.1 F (36.7 C), temperature source Oral, resp. rate 18, height 5\' 11"  (1.803 m), weight 103.874 kg (229 lb), SpO2 95 %.  Intake/Output Summary (Last 24 hours) at 03/03/14 1316 Last data filed at 03/03/14 1055  Gross per 24 hour  Intake    480 ml  Output   1600 ml  Net  -1120 ml    Exam Awake Alert, Oriented *3, No new F.N deficits, Normal affect [place].AT,PERRAL Supple Neck,No JVD, No cervical lymphadenopathy appriciated.  Symmetrical Chest wall movement, Good air movement bilaterally, CTAB RRR,No Gallops,Rubs or new Murmurs, No Parasternal Heave +ve B.Sounds, Abd Soft, Non tender, No organomegaly appriciated, No rebound -guarding or rigidity. No Cyanosis, Clubbing or edema, No new Rash or bruise  Data Review   Cultures -   CBC w Diff: Lab Results  Component Value Date   WBC 10.9* 03/03/2014   HGB 11.2* 03/03/2014   HCT 33.7* 03/03/2014   PLT 363 03/03/2014   CMP: Lab Results  Component Value Date   NA 137 03/03/2014   K 4.0 03/03/2014   CL 104 03/03/2014   CO2 24 03/03/2014   BUN 9 03/03/2014   CREATININE 0.72 03/03/2014   PROT 8.4* 02/28/2014   ALBUMIN 3.9 02/28/2014   BILITOT 0.2* 02/28/2014   ALKPHOS 96 02/28/2014   AST 34 02/28/2014   ALT 34 02/28/2014  .  Micro Results Recent Results (from the past 240 hour(s))  MRSA PCR  Screening     Status: None   Collection Time: 02/28/14  7:30 PM  Result Value Ref Range Status   MRSA by PCR NEGATIVE NEGATIVE Final    Comment:        The GeneXpert MRSA Assay (FDA approved for NASAL specimens only), is one component of a comprehensive MRSA colonization surveillance program. It is not intended to diagnose MRSA infection nor to guide or monitor treatment for MRSA infections.      Discharge Instructions      Follow-up Information    Follow up with Jeanice Lim, PA-C. Schedule an appointment as soon as possible for a visit in 1 week.   Contact information:   [address] [place] [address] [phone]       Discharge Medications  Medication List    STOP taking these medications        norethindrone-ethinyl estradiol 1-20 MG-MCG tablet  Commonly known as:  JUNEL FE,GILDESS FE,LOESTRIN FE     VALERIAN PO      TAKE these medications        ALLEGRA PO  Take 1 tablet by mouth daily.     HAIR/SKIN/NAILS Tabs  Take 1 tablet by mouth daily.     mometasone 50 MCG/ACT nasal spray  Commonly known as:  NASONEX  Place 2 sprays into the nose daily as needed (allergies).     oxyCODONE-acetaminophen 5-325 MG per tablet  Commonly known as:  PERCOCET/ROXICET  Take 1 tablet by mouth every 4 (four) hours as needed for severe pain.     Rivaroxaban 15 & 20 MG Tbpk  Commonly known as:  XARELTO STARTER PACK  Take as directed on package: Start with one  tablet by mouth twice a day with food. On Day 22, switch to one  tablet once a day with food.     valACYclovir 500 MG tablet  Commonly known as:  VALTREX  Take 500 mg by mouth daily.         Total Time in preparing paper work, data evaluation and todays exam - 35 minutes  Ximena Todaro M.D on 03/03/2014 at 1:16 PM  [facility] Office  [phone]

## 2014-03-03 NOTE — Progress Notes (Signed)
Discharged to home with family office visits in place teaching done  

## 2014-03-03 NOTE — Progress Notes (Signed)
Urinalysis shows blood. Pt reports that she has begun her menses with a mod-heavy flow. Menstrual blood noted in urine.

## 2014-03-05 LAB — FACTOR 5 LEIDEN

## 2014-03-05 LAB — PROTHROMBIN GENE MUTATION

## 2014-09-24 ENCOUNTER — Emergency Department (HOSPITAL_BASED_OUTPATIENT_CLINIC_OR_DEPARTMENT_OTHER): Payer: BLUE CROSS/BLUE SHIELD

## 2014-09-24 ENCOUNTER — Emergency Department (HOSPITAL_BASED_OUTPATIENT_CLINIC_OR_DEPARTMENT_OTHER)
Admission: EM | Admit: 2014-09-24 | Discharge: 2014-09-24 | Disposition: A | Discharge status: Home / Self Care | Payer: BLUE CROSS/BLUE SHIELD | Attending: Emergency Medicine | Admitting: Emergency Medicine

## 2014-09-24 ENCOUNTER — Encounter (HOSPITAL_BASED_OUTPATIENT_CLINIC_OR_DEPARTMENT_OTHER): Payer: Self-pay | Admitting: *Deleted

## 2014-09-24 DIAGNOSIS — M79605 Pain in left leg: Secondary | ICD-10-CM | POA: Diagnosis present

## 2014-09-24 DIAGNOSIS — Z79899 Other long term (current) drug therapy: Secondary | ICD-10-CM | POA: Diagnosis not present

## 2014-09-24 DIAGNOSIS — Z86718 Personal history of other venous thrombosis and embolism: Secondary | ICD-10-CM | POA: Diagnosis not present

## 2014-09-24 DIAGNOSIS — G8929 Other chronic pain: Secondary | ICD-10-CM | POA: Insufficient documentation

## 2014-09-24 DIAGNOSIS — M79662 Pain in left lower leg: Secondary | ICD-10-CM

## 2014-09-24 HISTORY — DX: Acute embolism and thrombosis of unspecified deep veins of unspecified lower extremity: I82.409

## 2014-09-24 NOTE — ED Notes (Signed)
Woke at 1am with pain in her left lower leg. Hx of DVT 7 months ago.

## 2014-09-24 NOTE — ED Provider Notes (Signed)
CSN: [id number]     Arrival date & time 09/24/14  1704 History   First MD Initiated Contact with Patient 09/24/14 1725     Chief Complaint  Patient presents with  . Leg Pain     (Consider location/radiation/quality/duration/timing/severity/associated sxs/prior Treatment) Patient is a 34 y.o. female presenting with leg pain. The history is provided by the patient.  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 Chronic back pain   . DVT (deep venous thrombosis)    Past Surgical History  Procedure Laterality Date  . Wisdom tooth extraction     No family history on file. Social History  Substance Use Topics  . Smoking status: Never Smoker   . Smokeless tobacco: None  . Alcohol Use: No   OB History    No data available     Review of Systems  Constitutional: Negative for fever.  All other systems reviewed and are negative.     Allergies  Mobic  Home Medications   Prior to Admission medications   Medication Sig Start Date End Date Taking? Authorizing Provider  Hydrocodone-Acetaminophen (VICODIN PO) Take by mouth.   Yes Historical Provider, MD  Fexofenadine HCl (ALLEGRA PO) Take 1 tablet by mouth daily.    Historical Provider, MD  mometasone (NASONEX) 50 MCG/ACT nasal spray Place 2 sprays into the nose daily as needed (allergies).    Historical Provider, MD  Multiple Vitamins-Minerals  (HAIR/SKIN/NAILS) TABS Take 1 tablet by mouth daily.    Historical Provider, MD  oxyCODONE-acetaminophen (PERCOCET/ROXICET) 5-325 MG per tablet Take 1 tablet by mouth every 4 (four) hours as needed for severe pain. 03/03/14   [name], MD  Rivaroxaban (XARELTO STARTER PACK) 15 & 20 MG TBPK Take as directed on package: Start with one 15mg  tablet by mouth twice a day with food. On Day 22, switch to one 20mg  tablet once a day with food. 03/03/14   [name], MD  valACYclovir (VALTREX) 500 MG tablet Take 500 mg by mouth daily.    Historical Provider, MD   BP 130/67 mmHg  Pulse 90  Temp(Src) 98.3 F (36.8 C) (Oral)  Resp 20  Ht 5\' 11"  (1.803 m)  Wt 243 lb (110.224 kg)  BMI 33.91 kg/m2  SpO2 99%  LMP 09/16/2014 Physical Exam  Constitutional: She is oriented to person, place, and time. She appears well-developed and well-nourished. No distress.  HENT:  Head: Normocephalic and atraumatic.  Cardiovascular: Normal rate and intact distal pulses.   Pulmonary/Chest: Effort normal.  Musculoskeletal: She exhibits tenderness.       Legs: Medial left calf tenderness with palpation. No noticeable edema. Negative Homans sign. No medial thigh tenderness  Neurological: She is alert and oriented to person, place, and time.  Skin: Skin is warm and dry. No erythema.  Psychiatric: She has a  normal mood and affect. Her behavior is normal.  Nursing note and vitals reviewed.   ED Course  Procedures (including critical care time) Labs Review Labs Reviewed - No data to display  Imaging Review US Venous Img Lower Unilateral Left  09/24/2014   CLINICAL DATA:  Left calf pain for 17 hours.  Initial encounter.  EXAM: LEFT LOWER EXTREMITY VENOUS DOPPLER ULTRASOUND  TECHNIQUE: Gray-scale sonography with graded compression, as well as color Doppler and duplex ultrasound were performed to evaluate the lower extremity deep venous systems from the level of the common femoral vein and including the common  femoral, femoral, profunda femoral, popliteal and calf veins including the posterior tibial, peroneal and gastrocnemius veins when visible. The superficial great saphenous vein was also interrogated. Spectral Doppler was utilized to evaluate flow at rest and with distal augmentation maneuvers in the common femoral, femoral and popliteal veins.  COMPARISON:  None.  FINDINGS: Contralateral Common Femoral Vein: Respiratory phasicity is normal and symmetric with the symptomatic side. No evidence of thrombus. Normal compressibility.  Common Femoral Vein: No evidence of thrombus. Normal compressibility, respiratory phasicity and response to augmentation.  Saphenofemoral Junction: No evidence of thrombus. Normal compressibility and flow on color Doppler imaging.  Profunda Femoral Vein: No evidence of thrombus. Normal compressibility and flow on color Doppler imaging.  Femoral Vein: No evidence of thrombus. Normal compressibility, respiratory phasicity and response to augmentation.  Popliteal Vein: No evidence of thrombus. Normal compressibility, respiratory phasicity and response to augmentation.  Calf Veins: No evidence of thrombus. Normal compressibility and flow on color Doppler imaging. The peroneal vein is not well seen.  Superficial Great Saphenous Vein: No evidence of thrombus. Normal compressibility and flow on color Doppler imaging.  Venous Reflux:  None.  Other Findings:  None.  IMPRESSION: No evidence of deep venous thrombosis.   Electronically Signed   By: Roanna Raider M.D.   On: 09/24/2014 19:45   I have personally reviewed and evaluated these images and lab results as part of my medical decision-making.   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 She  gets intermittent swelling in that leg but denies any recent trauma. She has ongoing back issues but states that this does not feel like the pain from her pinched nerve. She was thought to have had the DVT from being on birth control she followed up with a hematologist and was negative for any type of clotting disorder.  On exam today patient has some mild left calf tenderness without any appreciable edema.  Doppler of the left lower extremity ordered to rule out new DVT. The last ultrasound patient had in May her DVT had completely resolved.  8:46 PM U/s neg for DVT and pt d/ced home.  Gwyneth Sprout, MD 09/24/14 2047

## 2017-11-17 ENCOUNTER — Emergency Department (HOSPITAL_BASED_OUTPATIENT_CLINIC_OR_DEPARTMENT_OTHER): Payer: BLUE CROSS/BLUE SHIELD

## 2017-11-17 ENCOUNTER — Inpatient Hospital Stay (HOSPITAL_BASED_OUTPATIENT_CLINIC_OR_DEPARTMENT_OTHER)
Admission: EM | Admit: 2017-11-17 | Discharge: 2017-11-21 | DRG: 175 | Disposition: A | Discharge status: Home Health | Payer: BLUE CROSS/BLUE SHIELD | Attending: Internal Medicine | Admitting: Internal Medicine

## 2017-11-17 ENCOUNTER — Encounter (HOSPITAL_BASED_OUTPATIENT_CLINIC_OR_DEPARTMENT_OTHER): Payer: Self-pay

## 2017-11-17 ENCOUNTER — Other Ambulatory Visit: Payer: Self-pay

## 2017-11-17 DIAGNOSIS — Z86711 Personal history of pulmonary embolism: Secondary | ICD-10-CM

## 2017-11-17 DIAGNOSIS — I2609 Other pulmonary embolism with acute cor pulmonale: Secondary | ICD-10-CM | POA: Diagnosis not present

## 2017-11-17 DIAGNOSIS — Z79899 Other long term (current) drug therapy: Secondary | ICD-10-CM

## 2017-11-17 DIAGNOSIS — M5127 Other intervertebral disc displacement, lumbosacral region: Secondary | ICD-10-CM | POA: Diagnosis present

## 2017-11-17 DIAGNOSIS — Z86718 Personal history of other venous thrombosis and embolism: Secondary | ICD-10-CM

## 2017-11-17 DIAGNOSIS — M7989 Other specified soft tissue disorders: Secondary | ICD-10-CM | POA: Diagnosis not present

## 2017-11-17 DIAGNOSIS — G8929 Other chronic pain: Secondary | ICD-10-CM | POA: Diagnosis present

## 2017-11-17 DIAGNOSIS — I2692 Saddle embolus of pulmonary artery without acute cor pulmonale: Secondary | ICD-10-CM | POA: Diagnosis not present

## 2017-11-17 DIAGNOSIS — I82452 Acute embolism and thrombosis of left peroneal vein: Secondary | ICD-10-CM | POA: Diagnosis present

## 2017-11-17 DIAGNOSIS — I2699 Other pulmonary embolism without acute cor pulmonale: Secondary | ICD-10-CM | POA: Diagnosis not present

## 2017-11-17 DIAGNOSIS — R52 Pain, unspecified: Secondary | ICD-10-CM

## 2017-11-17 DIAGNOSIS — I361 Nonrheumatic tricuspid (valve) insufficiency: Secondary | ICD-10-CM | POA: Diagnosis not present

## 2017-11-17 DIAGNOSIS — Z7901 Long term (current) use of anticoagulants: Secondary | ICD-10-CM

## 2017-11-17 DIAGNOSIS — R609 Edema, unspecified: Secondary | ICD-10-CM | POA: Diagnosis not present

## 2017-11-17 HISTORY — DX: Other pulmonary embolism without acute cor pulmonale: I26.99

## 2017-11-17 LAB — CBC WITH DIFFERENTIAL/PLATELET
Abs Immature Granulocytes: 0.05 10*3/uL (ref 0.00–0.07)
BASOS PCT: 0 %
Basophils Absolute: 0.1 10*3/uL (ref 0.0–0.1)
Eosinophils Absolute: 0.2 10*3/uL (ref 0.0–0.5)
Eosinophils Relative: 2 %
HCT: 40.1 % (ref 36.0–46.0)
Hemoglobin: 13 g/dL (ref 12.0–15.0)
Immature Granulocytes: 0 %
Lymphocytes Relative: 37 %
Lymphs Abs: 5 10*3/uL — ABNORMAL HIGH (ref 0.7–4.0)
MCH: 27.7 pg (ref 26.0–34.0)
MCHC: 32.4 g/dL (ref 30.0–36.0)
MCV: 85.5 fL (ref 80.0–100.0)
Monocytes Absolute: 0.8 10*3/uL (ref 0.1–1.0)
Monocytes Relative: 6 %
NEUTROS ABS: 7.3 10*3/uL (ref 1.7–7.7)
NEUTROS PCT: 55 %
PLATELETS: 192 10*3/uL (ref 150–400)
RBC: 4.69 MIL/uL (ref 3.87–5.11)
RDW: 16.7 % — ABNORMAL HIGH (ref 11.5–15.5)
WBC: 13.4 10*3/uL — AB (ref 4.0–10.5)
nRBC: 0 % (ref 0.0–0.2)

## 2017-11-17 LAB — BASIC METABOLIC PANEL
Anion gap: 7 (ref 5–15)
BUN: 16 mg/dL (ref 6–20)
CO2: 22 mmol/L (ref 22–32)
Calcium: 8.7 mg/dL — ABNORMAL LOW (ref 8.9–10.3)
Chloride: 108 mmol/L (ref 98–111)
Creatinine, Ser: 0.81 mg/dL (ref 0.44–1.00)
GFR calc Af Amer: >60 mL/min (ref >60)
GFR calc non Af Amer: >60 mL/min (ref >60)
GLUCOSE: 125 mg/dL — AB (ref 70–99)
Potassium: 3.8 mmol/L (ref 3.5–5.1)
SODIUM: 137 mmol/L (ref 135–145)

## 2017-11-17 LAB — GLUCOSE, CAPILLARY: Glucose-Capillary: 127 mg/dL — ABNORMAL HIGH (ref 70–99)

## 2017-11-17 LAB — TROPONIN I: Troponin I: 0.05 ng/mL (ref <0.03)

## 2017-11-17 MED ORDER — HEPARIN (PORCINE) IN NACL 100-0.45 UNIT/ML-% IJ SOLN
INTRAMUSCULAR | Status: AC
  Filled 2017-11-17: qty 250

## 2017-11-17 MED ORDER — MORPHINE SULFATE (PF) 4 MG/ML IV SOLN
4.0000 mg | Freq: Once | INTRAVENOUS | Status: AC
  Administered 2017-11-17: 4 mg via INTRAVENOUS
  Filled 2017-11-17: qty 1

## 2017-11-17 MED ORDER — HEPARIN BOLUS VIA INFUSION
5500.0000 [IU] | Freq: Once | INTRAVENOUS | Status: AC
  Administered 2017-11-17: 5500 [IU] via INTRAVENOUS

## 2017-11-17 MED ORDER — ACETAMINOPHEN 160 MG/5ML PO SOLN
650.0000 mg | ORAL | Status: DC | PRN
  Administered 2017-11-19 – 2017-11-20 (×2): 650 mg
  Filled 2017-11-17 (×2): qty 20.3

## 2017-11-17 MED ORDER — IOPAMIDOL (ISOVUE-370) INJECTION 76%
100.0000 mL | Freq: Once | INTRAVENOUS | Status: AC | PRN
  Administered 2017-11-17: 100 mL via INTRAVENOUS

## 2017-11-17 MED ORDER — MORPHINE SULFATE (PF) 2 MG/ML IV SOLN
2.0000 mg | Freq: Four times a day (QID) | INTRAVENOUS | Status: DC | PRN
  Administered 2017-11-18 – 2017-11-21 (×12): 2 mg via INTRAVENOUS
  Filled 2017-11-17 (×12): qty 1

## 2017-11-17 MED ORDER — HEPARIN (PORCINE) IN NACL 100-0.45 UNIT/ML-% IJ SOLN
1600.0000 [IU]/h | INTRAMUSCULAR | Status: DC
  Administered 2017-11-17 – 2017-11-20 (×5): 1600 [IU]/h via INTRAVENOUS
  Filled 2017-11-17 (×4): qty 250

## 2017-11-17 NOTE — ED Notes (Signed)
Pt c/o SOB upon exertion worsening for the last few days. Pt denies chest pain and denies back pain other than her normal lower back pain. Denies fevers, denies long trips, not on birth control, and no increase in caffeine or stimulants.

## 2017-11-17 NOTE — Plan of Care (Signed)
  Problem: Education: Goal: Knowledge of General Education information will improve Description Including pain rating scale, medication(s)/side effects and non-pharmacologic comfort measures Outcome: Progressing   

## 2017-11-17 NOTE — Progress Notes (Signed)
ANTICOAGULATION CONSULT NOTE - Initial Consult  Pharmacy Consult for heparin Indication: pulmonary embolus  Allergies  Allergen Reactions  . Mobic [Meloxicam] Swelling    Patient Measurements: Height: 5\' 11"  (180.3 cm) Weight: 250 lb (113.4 kg) IBW/kg (Calculated) : 70.8 Heparin Dosing Weight: 96 kg  Vital Signs: Temp: 98.9 F (37.2 C) (10/24 1739) Temp Source: Oral (10/24 1739) BP: 127/63 (10/24 1930) Pulse Rate: 119 (10/24 1930)  Labs: Recent Labs    11/17/17 1753 11/17/17 1830  HGB 13.0  --   HCT 40.1  --   PLT 192  --   CREATININE  --  0.81  TROPONINI 0.05*  --     Estimated Creatinine Clearance: 130.5 mL/min (by C-G formula based on SCr of 0.81 mg/dL).   Medical History: Past Medical History:  Diagnosis Date  . Chronic back pain   . DVT (deep venous thrombosis) (HCC)   . Pulmonary emboli (HCC)     Medications:  Scheduled:  . heparin  5,500 Units Intravenous Once   Infusions:  . heparin      Assessment: Pt is a 71 YOF presenting with c/o SOB x4 days. Pt states he has h/o DVT and PE and is not currently on anticoagulation. CT is positive for bilateral acute PE with evidence of right heart strain consistent with at least submassive PE. Pharmacy has been consulted for heparin for PE. Hgb - 13, Plt - 192   Goal of Therapy:  Heparin level 0.3-0.7 units/ml Monitor platelets by anticoagulation protocol: Yes   Plan:  Heparin 5500 unit bolus x1, then heparin gtt 1600 units/hr 6-Hour HL at 0200 Daily HL and CBC  Pietro Bonura J Briar Witherspoon 11/17/2017,7:57 PM

## 2017-11-17 NOTE — Progress Notes (Signed)
eLink Physician-Brief Progress Note Patient Name: Paula Mack DOB: 03-17-1980 MRN: [id number]   Date of Service  11/17/2017  HPI/Events of Note  History of PE/DVT 2016 thought to be secondary to OCP use.  Presents with shortness of breath and found to have bilateral PE with right heart strain.  Lumbar radiculopathy with mild chronic lower extremity edema s/p epidural injection 10/7.  eICU Interventions  Submassive PE on heparin drip.     Intervention Category Evaluation Type: New Patient Evaluation  Darl Pikes 11/17/2017, 10:40 PM

## 2017-11-17 NOTE — ED Notes (Signed)
X-ray at bedside

## 2017-11-17 NOTE — H&P (Signed)
NAME:  Paula Mack, MRN:  [id number], DOB:  11/26/1980, LOS: 0 ADMISSION DATE:  11/17/2017, CONSULTATION DATE:  10/24  REFERRING MD:  Dr. Rubin Payor EDP, CHIEF COMPLAINT:  Pulmonary Embolism   Brief History   37 year old with previous PE again being admitted for PE with R heart strain. HD stable and on room air.   Past Medical History  Pulmonary embolism, DVT, herniated disk.   Significant Hospital Events   20/24 admit  Consults: date of consult/date signed off & final recs:    Procedures (surgical and bedside):    Significant Diagnostic Tests:  CTA chest 10/24 > Bilateral acute PE with RV/LV ratio 2.01  Micro Data:    Antimicrobials:     Subjective:    Objective   Blood pressure 124/75, pulse (!) 118, temperature 98.9 F (37.2 C), temperature source Oral, resp. rate (!) 23, height 5\' 11"  (1.803 m), weight 113.4 kg, last menstrual period 10/27/2017, SpO2 96 %.       No intake or output data in the 24 hours ending 11/17/17 2209 Filed Weights   11/17/17 1930  Weight: 113.4 kg    Examination: General: overweight female in NAD HENT: [place]/AT, PERRL, no JVD Lungs: Clear bilateral breath sounds Cardiovascular: Tachy, regular, no MRG Abdomen: Soft, non-tender, non-distended Extremities: No acute deformity or ROM limitation. No obvious lower extremity edema.  Neuro: Alert, oriented, non-focal  Resolved Hospital Problem list     Assessment & Plan:   Pulmonary emboli: Bilateral. History of PE as well, which was attributed to oral contraceptives. Hypercoagulable workup at that time negative. Treated with 6 months of Xarelto. Now CT showing RV/LV ratio of 2.1, but she is breathing comfortably on room air with O2 sat of 97%. Unclear how acute this change is considering her prior PE. May be some component of CTEPH - Continue heparin infusion dosed per pharmacy - Obtain echocardiogram - If right heart strain confirmed she would be reasonable candidate for catheter directed  thrombolysis.  - NPO after midnight.   Herniated disk: being treated with epidural injections, prednisone, NSAID, muscle relax . S/p first injection 2 weeks ago.  - Pain management with PNR morphine   Disposition / Summary of Today's Plan 11/17/17   ICU, can likely transfer to SDU in the morning. Continue heparin Await echo May benefit from lysis.     Diet: NPO after midnight Pain/Anxiety/Delirium protocol (if indicated): Morphine IV VAP protocol (if indicated): n/a DVT prophylaxis: full dose heparin GI prophylaxis:  Hyperglycemia protocol: n/a Mobility: BR Code Status: Full Family Communication: patient updated.   Labs   CBC: Recent Labs  Lab 11/17/17 1753  WBC 13.4*  NEUTROABS 7.3  HGB 13.0  HCT 40.1  MCV 85.5  PLT 192    Basic Metabolic Panel: Recent Labs  Lab 11/17/17 1830  NA 137  K 3.8  CL 108  CO2 22  GLUCOSE 125*  BUN 16  CREATININE 0.81  CALCIUM 8.7*   GFR: Estimated Creatinine Clearance: 130.5 mL/min (by C-G formula based on SCr of 0.81 mg/dL). Recent Labs  Lab 11/17/17 1753  WBC 13.4*    Liver Function Tests: No results for input(s): AST, ALT, ALKPHOS, BILITOT, PROT, ALBUMIN in the last 168 hours. No results for input(s): LIPASE, AMYLASE in the last 168 hours. No results for input(s): AMMONIA in the last 168 hours.  ABG No results found for: PHART, PCO2ART, PO2ART, HCO3, TCO2, ACIDBASEDEF, O2SAT   Coagulation Profile: No results for input(s): INR, PROTIME in the last  168 hours.  Cardiac Enzymes: Recent Labs  Lab 11/17/17 1753  TROPONINI 0.05*    HbA1C: No results found for: HGBA1C  CBG: Recent Labs  Lab 11/17/17 2145  GLUCAP 127*    Admitting History of Present Illness.   37 year old female with PMH as below, which is significant for DVT/PE and chornic back pain from herniated disk. PE in the past was attributed to oral contraception and she was treated with 6 months of Xarelto. Hypercoagulable workup at that time was  negative. She has been followed for herniated disk being treated with epidural injections, prednisone, cyclobenzaprine, and tramadol.  First injection was a couple weeks prior to arrival.   She first noted SOB 10/21 when carrying her dog up the stairs and needing to stop and catch her breath. She has been DOE since that time. She thought this was maybe due to the cyclobenzaprine she had been taking for her back, so she stopped this and waited in hopes symptoms would improve. They did now, which lead her to present to the ED 10/24 with this complaint. CT angiogram of the chest was done demonstrating bilateral PE and R heart strain. She was transferred to [facility] for evaluation by PCCM.    Review of Systems:   Bolds are positive  Constitutional: weight loss, gain, night sweats, Fevers, chills, fatigue .  HEENT: headaches, Sore throat, sneezing, nasal congestion, post nasal drip, Difficulty swallowing, Tooth/dental problems, visual complaints visual changes, ear ache CV:  chest pain, radiates:,Orthopnea, PND, swelling in lower extremities, dizziness, palpitations, syncope.  GI  heartburn, indigestion, abdominal pain, nausea, vomiting, diarrhea, change in bowel habits, loss of appetite, bloody stools.  Resp: cough, productive:, hemoptysis, dyspnea, chest pain, pleuritic.  Skin: rash or itching or icterus GU: dysuria, change in color of urine, urgency or frequency. flank pain, hematuria  MS: joint pain or swelling. decreased range of motion  Psych: change in mood or affect. depression or anxiety.  Neuro: difficulty with speech, weakness, numbness, ataxia ]  Past Medical History  She,  has a past medical history of Chronic back pain, DVT (deep venous thrombosis) (HCC), and Pulmonary emboli (HCC).   Surgical History    Past Surgical History:  Procedure Laterality Date  . BACK SURGERY    . WISDOM TOOTH EXTRACTION    . WRIST FRACTURE SURGERY       Social History   Social History    Socioeconomic History  . Marital status: Single    Spouse name: Not on file  . Number of children: Not on file  . Years of education: Not on file  . Highest education level: Not on file  Occupational History  . Not on file  Social Needs  . Financial resource strain: Not on file  . Food insecurity:    Worry: Not on file    Inability: Not on file  . Transportation needs:    Medical: Not on file    Non-medical: Not on file  Tobacco Use  . Smoking status: Never Smoker  . Smokeless tobacco: Never Used  Substance and Sexual Activity  . Alcohol use: No  . Drug use: No  . Sexual activity: Not on file  Lifestyle  . Physical activity:    Days per week: Not on file    Minutes per session: Not on file  . Stress: Not on file  Relationships  . Social connections:    Talks on phone: Not on file    Gets together: Not on file  Attends religious service: Not on file    Active member of club or organization: Not on file    Attends meetings of clubs or organizations: Not on file    Relationship status: Not on file  . Intimate partner violence:    Fear of current or ex partner: Not on file    Emotionally abused: Not on file    Physically abused: Not on file    Forced sexual activity: Not on file  Other Topics Concern  . Not on file  Social History Narrative  . Not on file  ,  reports that she has never smoked. She has never used smokeless tobacco. She reports that she does not drink alcohol or use drugs.   Family History   Her family history is not on file.   Allergies Allergies  Allergen Reactions  . Mobic [Meloxicam] Swelling     Home Medications  Prior to Admission medications   Medication Sig Start Date End Date Taking? Authorizing Provider  Fexofenadine HCl (ALLEGRA PO) Take 1 tablet by mouth daily.    [provider]  Hydrocodone-Acetaminophen (VICODIN PO) Take by mouth.    [provider]  mometasone (NASONEX) 50 MCG/ACT nasal spray Place 2  sprays into the nose daily as needed (allergies).    [provider]  Multiple Vitamins-Minerals (HAIR/SKIN/NAILS) TABS Take 1 tablet by mouth daily.    [provider]  oxyCODONE-acetaminophen (PERCOCET/ROXICET) 5-325 MG per tablet Take 1 tablet by mouth every 4 (four) hours as needed for severe pain. 03/03/14   Elgergawy, Leana Roe, MD  Rivaroxaban (XARELTO STARTER PACK) 15 & 20 MG TBPK Take as directed on package: Start with one 15mg  tablet by mouth twice a day with food. On Day 22, switch to one 20mg  tablet once a day with food. 03/03/14   Elgergawy, Leana Roe, MD  valACYclovir (VALTREX) 500 MG tablet Take 500 mg by mouth daily.    [provider]     Critical care time:      Joneen Roach, AGACNP-BC [facility] Pulmonary/Critical Care Pager [phone] or [phone]  11/17/2017 10:28 PM

## 2017-11-17 NOTE — ED Notes (Signed)
Patient transported to CT 

## 2017-11-17 NOTE — ED Notes (Signed)
Called [facility] to repage the intensivist.

## 2017-11-17 NOTE — ED Notes (Signed)
Pt returned from xray

## 2017-11-17 NOTE — ED Triage Notes (Signed)
C/o SOB x 4 days-DOE-denies flu like sx and CP-states she does have hx of DVT and PE-not on blood thinners

## 2017-11-17 NOTE — ED Provider Notes (Signed)
[facility] EMERGENCY DEPARTMENT Provider Note   CSN: [id number] Arrival date & time: 11/17/17  1734     History   Chief Complaint Chief Complaint  Patient presents with  . Shortness of Breath    HPI Michaelene Dutan is a 37 y.o. female.  HPI Patient presents with shortness of breath.  Began around 4 days ago.  States it comes on with exertion.  No swelling in her legs.  History of pulmonary embolisms but states this feels differently.  No fevers.  No cough.  Has been treated for back pain recently.  Has been on prednisone and cyclobenzaprine.  Also just started Ultram.  No fevers.  No cough.  No chest pain.  She is not currently on anticoagulation. Past Medical History:  Diagnosis Date  . Chronic back pain   . DVT (deep venous thrombosis) (HCC)   . Pulmonary emboli [facility])     Patient Active Problem List   Diagnosis Date Noted  . Pulmonary embolism (HCC) 11/17/2017  . PE (pulmonary thromboembolism) (HCC) 02/28/2014  . Leg swelling     Past Surgical History:  Procedure Laterality Date  . BACK SURGERY    . WISDOM TOOTH EXTRACTION    . WRIST FRACTURE SURGERY       OB History   None      Home Medications    Prior to Admission medications   Medication Sig Start Date End Date Taking? Authorizing Provider  Fexofenadine HCl (ALLEGRA PO) Take 1 tablet by mouth daily.    [provider]  Hydrocodone-Acetaminophen (VICODIN PO) Take by mouth.    [provider]  mometasone (NASONEX) 50 MCG/ACT nasal spray Place 2 sprays into the nose daily as needed (allergies).    [provider]  Multiple Vitamins-Minerals (HAIR/SKIN/NAILS) TABS Take 1 tablet by mouth daily.    [provider]  oxyCODONE-acetaminophen (PERCOCET/ROXICET) 5-325 MG per tablet Take 1 tablet by mouth every 4 (four) hours as needed for severe pain. 03/03/14   Elgergawy, Leana Roe, MD  Rivaroxaban (XARELTO STARTER PACK) 15 & 20 MG TBPK Take as directed on package: Start  with one 15mg  tablet by mouth twice a day with food. On Day 22, switch to one 20mg  tablet once a day with food. 03/03/14   Elgergawy, Leana Roe, MD  valACYclovir (VALTREX) 500 MG tablet Take 500 mg by mouth daily.    [provider]    Family History No family history on file.  Social History Social History   Tobacco Use  . Smoking status: Never Smoker  . Smokeless tobacco: Never Used  Substance Use Topics  . Alcohol use: No  . Drug use: No     Allergies   Mobic [meloxicam]   Review of Systems Review of Systems  Constitutional: Positive for fatigue. Negative for appetite change.  Respiratory: Positive for shortness of breath.   Cardiovascular: Negative for chest pain.  Gastrointestinal: Negative for abdominal pain.  Genitourinary: Negative for flank pain.  Musculoskeletal: Positive for back pain.  Skin: Negative for rash.  Neurological: Negative for weakness.  Hematological: Negative for adenopathy.  Psychiatric/Behavioral: Negative for confusion.     Physical Exam Updated Vital Signs BP 124/75   Pulse (!) 118   Temp 98.9 F (37.2 C) (Oral)   Resp (!) 23   Ht 5\' 11"  (1.803 m)   Wt 113.4 kg   LMP 10/27/2017 (Approximate)   SpO2 96%   BMI 34.87 kg/m   Physical Exam  Constitutional: She appears well-developed.  Neck: Neck supple.  Cardiovascular:  Tachycardia  Pulmonary/Chest:  Mild tachypnea.  Abdominal: There is no tenderness.  Musculoskeletal:       Right lower leg: She exhibits no edema.       Left lower leg: She exhibits no edema.  Back pain with movement of lower extremities.  Neurological: She is alert.  Skin: Capillary refill takes less than 2 seconds.     ED Treatments / Results  Labs (all labs ordered are listed, but only abnormal results are displayed) Labs Reviewed  CBC WITH DIFFERENTIAL/PLATELET - Abnormal; Notable for the following components:      Result Value   WBC 13.4 (*)    RDW 16.7 (*)    Lymphs Abs 5.0 (*)    All  other components within normal limits  TROPONIN I - Abnormal; Notable for the following components:   Troponin I 0.05 (*)    All other components within normal limits  BASIC METABOLIC PANEL - Abnormal; Notable for the following components:   Glucose, Bld 125 (*)    Calcium 8.7 (*)    All other components within normal limits  PREGNANCY, URINE    EKG EKG Interpretation  Date/Time:  Thursday November 17 2017 17:50:55 EDT Ventricular Rate:  130 PR Interval:    QRS Duration: 92 QT Interval:  331 QTC Calculation: 487 R Axis:   79 Text Interpretation:  Sinus tachycardia Nonspecific T abnormalities, anterior leads Borderline prolonged QT interval No significant change since last tracing Confirmed by Benjiman Core [phone]) on 11/17/2017 6:01:00 PM   Radiology Ct Angio Chest Pe W And/or Wo Contrast  Result Date: 11/17/2017 CLINICAL DATA:  SOB upon exertion worsening for the last few days. Pt denies chest pain and denies back pain other than her normal lower back pain. Denies fevers, denies long trips, not on birth control, and no increase in caffeine or stimulants EXAM: CT ANGIOGRAPHY CHEST WITH CONTRAST TECHNIQUE: Multidetector CT imaging of the chest was performed using the standard protocol during bolus administration of intravenous contrast. Multiplanar CT image reconstructions and MIPs were obtained to evaluate the vascular anatomy. CONTRAST:  ISOVUE-370 IOPAMIDOL (ISOVUE-370) INJECTION 76% COMPARISON:  None. FINDINGS: Cardiovascular: Heart size normal. No pericardial effusion. Central and segmental bilateral pulmonary emboli. Dilated right ventricle. RV/LV ratio 2.01. Adequate contrast opacification of the thoracic aorta with no evidence of dissection, aneurysm, or stenosis. There is classic 3-vessel brachiocephalic arch anatomy without proximal stenosis. No significant atheromatous change. Mediastinum/Nodes: No hilar or mediastinal adenopathy. Lungs/Pleura: No pleural effusion. No  pneumothorax. Lungs are clear. Upper Abdomen: No acute findings Musculoskeletal: No chest wall abnormality. No acute or significant osseous findings. Review of the MIP images confirms the above findings. IMPRESSION: 1. POSITIVE for BILATERAL acute PE with CT evidence of right heart strain (RV/LV Ratio = 2.01) consistent with at least submassive (intermediate risk) PE. The presence of right heart strain has been associated with an increased risk of morbidity and mortality. Please activate Code PE by paging [phone]. Electronically Signed   By: Corlis Leak M.D.   On: 11/17/2017 19:06   Dg Chest Portable 1 View  Result Date: 11/17/2017 CLINICAL DATA:  Shortness of breath. EXAM: PORTABLE CHEST 1 VIEW COMPARISON:  February 28, 2014 FINDINGS: Study limited due to rotated low volume portable technique. No pneumothorax. The cardiomediastinal silhouette is unremarkable. The lungs are clear. IMPRESSION: Limited study with no acute abnormality noted. Electronically Signed   By: Gerome Sam III M.D   On: 11/17/2017 18:48  Procedures Procedures (including critical care time)  Medications Ordered in ED Medications  heparin ADULT infusion 100 units/mL (25000 units/259mL sodium chloride 0.45%) (1,600 Units/hr Intravenous New Bag/Given 11/17/17 2002)  heparin 100-0.45 UNIT/ML-% infusion (has no administration in time range)  iopamidol (ISOVUE-370) 76 % injection 100 mL (100 mLs Intravenous Contrast Given 11/17/17 1849)  heparin bolus via infusion 5,500 Units (5,500 Units Intravenous Bolus from Bag 11/17/17 2004)  morphine 4 MG/ML injection 4 mg (4 mg Intravenous Given 11/17/17 2040)     Initial Impression / Assessment and Plan / ED Course  I have reviewed the triage vital signs and the nursing notes.  Pertinent labs & imaging results that were available during my care of the patient were reviewed by me and considered in my medical decision making (see chart for details).     Patient presents with  shortness of breath.  Previous history of pulmonary embolism.  Tachycardic with mildly elevated troponin.  Not hypoxic or hypotensive however.  Heart rate will go up to 140 with ambulation.  CT scan shows large bilateral pulmonary embolisms.  RV to LV ratio of 2.  Discussed with intensivist.  Discussed with Dr. Violet Baldy.  Patient will be admitted to the ICU.  Patient had an epidural injection 2 weeks ago.  Has been started on heparin.  CRITICAL CARE Performed by: Benjiman Core Total critical care time: 30 minutes Critical care time was exclusive of separately billable procedures and treating other patients. Critical care was necessary to treat or prevent imminent or life-threatening deterioration. Critical care was time spent personally by me on the following activities: development of treatment plan with patient and/or surrogate as well as nursing, discussions with consultants, evaluation of patient's response to treatment, examination of patient, obtaining history from patient or surrogate, ordering and performing treatments and interventions, ordering and review of laboratory studies, ordering and review of radiographic studies, pulse oximetry and re-evaluation of patient's condition.   Final Clinical Impressions(s) / ED Diagnoses   Final diagnoses:  Acute pulmonary embolism with acute cor pulmonale, unspecified pulmonary embolism type [facility])    ED Discharge Orders    None       Benjiman Core, MD 11/17/17 2049

## 2017-11-18 ENCOUNTER — Inpatient Hospital Stay (HOSPITAL_COMMUNITY): Payer: BLUE CROSS/BLUE SHIELD

## 2017-11-18 DIAGNOSIS — I2692 Saddle embolus of pulmonary artery without acute cor pulmonale: Secondary | ICD-10-CM

## 2017-11-18 DIAGNOSIS — I361 Nonrheumatic tricuspid (valve) insufficiency: Secondary | ICD-10-CM

## 2017-11-18 LAB — PROTIME-INR
INR: 1.15
Prothrombin Time: 14.6 seconds (ref 11.4–15.2)

## 2017-11-18 LAB — ECHOCARDIOGRAM COMPLETE
HEIGHTINCHES: 71 in
WEIGHTICAEL: 4102.32 [oz_av]

## 2017-11-18 LAB — CBC
HEMATOCRIT: 39 % (ref 36.0–46.0)
Hemoglobin: 12.4 g/dL (ref 12.0–15.0)
MCH: 27.3 pg (ref 26.0–34.0)
MCHC: 31.8 g/dL (ref 30.0–36.0)
MCV: 85.9 fL (ref 80.0–100.0)
NRBC: 0 % (ref 0.0–0.2)
PLATELETS: 189 10*3/uL (ref 150–400)
RBC: 4.54 MIL/uL (ref 3.87–5.11)
RDW: 16.7 % — AB (ref 11.5–15.5)
WBC: 11.5 10*3/uL — AB (ref 4.0–10.5)

## 2017-11-18 LAB — HIV ANTIBODY (ROUTINE TESTING W REFLEX): HIV Screen 4th Generation wRfx: NONREACTIVE

## 2017-11-18 LAB — HEPARIN LEVEL (UNFRACTIONATED)
Heparin Unfractionated: 0.62 IU/mL (ref 0.30–0.70)
Heparin Unfractionated: 0.43 IU/mL (ref 0.30–0.70)

## 2017-11-18 LAB — MRSA PCR SCREENING: MRSA by PCR: NEGATIVE

## 2017-11-18 MED ORDER — FLUCONAZOLE 150 MG PO TABS
150.0000 mg | ORAL_TABLET | Freq: Once | ORAL | Status: AC
  Administered 2017-11-18: 150 mg via ORAL
  Filled 2017-11-18 (×2): qty 1

## 2017-11-18 MED ORDER — TRAMADOL HCL 50 MG PO TABS
50.0000 mg | ORAL_TABLET | Freq: Four times a day (QID) | ORAL | Status: DC | PRN
  Administered 2017-11-18 – 2017-11-21 (×8): 50 mg via ORAL
  Filled 2017-11-18 (×8): qty 1

## 2017-11-18 NOTE — H&P (Addendum)
NAME:  Paula Mack, MRN:  [id number], DOB:  1980-09-10, LOS: 1 ADMISSION DATE:  11/17/2017, CONSULTATION DATE:  10/24  REFERRING MD:  Dr. Rubin Payor EDP, CHIEF COMPLAINT:  Pulmonary Embolism   Brief History   37 year old with previous PE ( 02/2014, treated with Lesia Hausen x 6 months) again being admitted 10/24 for PE with R heart strain. HD stable and on room air.   Past Medical History  Pulmonary embolism, DVT, herniated disk.  Last epidural injection 10/31/2017  Significant Hospital Events   Epidural injection 10/31/2017 10/24 admit Consults: date of consult/date signed off & final recs:  10/25>>IR  Procedures (surgical and bedside):  10/25 Echo: The cavity size was moderately dilated. Wall  thickness was normal. Systolic function was moderately reduced.  RV systolic pressure (S, est): 57 mm Hg. EF 65-70% There was no evidence of elevated  ventricular filling pressure by Doppler parameters. Systolic pressure was moderately increased.  PA peak pressure: 57 mm Hg (S). Significant Diagnostic Tests:  CTA chest 10/24 > Bilateral acute PE with RV/LV ratio 2.01  Micro Data:    Antimicrobials:     Subjective:  States she is breathing well. She states she has worsening shortness of breath with any exertion.She is anxious to know  the results of the ECHO, to determine if she needs catheter directed lysis. She has ongoing back pain. She is tearful. She is complaining of L leg pain which she states is due to her back pain.  Objective   Blood pressure 123/82, pulse (!) 108, temperature 98.3 F (36.8 C), temperature source Oral, resp. rate (!) 31, height 5\' 11"  (1.803 m), weight 116.3 kg, last menstrual period 10/27/2017, SpO2 96 %.        Intake/Output Summary (Last 24 hours) at 11/18/2017 1021 Last data filed at 11/18/2017 0700 Gross per 24 hour  Intake 708.99 ml  Output -  Net 708.99 ml   Filed Weights   11/17/17 1930 11/17/17 2200 11/18/17 0400  Weight: 113.4 kg 116.3 kg  116.3 kg    Examination: General: overweight female in NAD, supine in bed, wearing 2 L [place] HENT: [place]/AT, PERRL, no JVD, no LAD Lungs: Bilateral excursion, Clear bilateral breath sounds, diminished per bases Cardiovascular:S1. S2,  Tachy, regular, no MRG Abdomen: Soft, non-tender, non-distended, BS +, Obese Extremities: No acute deformity no ROM limitation. Trace  lower extremity edema. Brisk refill  Neuro: Alert, oriented x 3, non-focal, conversational  Resolved Hospital Problem list     Assessment & Plan:   Pulmonary emboli: Bilateral. History of PE as well 02/2014 , which was attributed to oral contraceptives and decreased mobility due to back pain.Marland Kitchen Hypercoagulable workup at that time negative. Treated with 6 months of Xarelto. Now CT showing RV/LV ratio of 2.1, but she is breathing comfortably on room air with O2 sat of 97%. Unclear how acute this change is considering her prior PE. May be some component of CTEPH She did have an epidural injection for back pain 10/31/2017 - Continue heparin infusion dosed per pharmacy - Therapeutic 10/25 @ 0.62 - Echo done, results pending - If right heart strain confirmed consider  catheter directed thrombolysis.  - IR have evaluated the patient - NPO until decision is made - Monitor for bleeding - CBC daily - Transition to po anticoag when appropriate  Herniated disk: being treated with epidural injections, prednisone, NSAID, muscle relax .  S/p first injection 2 weeks ago.( 10/7)  - Pain management with PNR morphine - Lower extremity Neuro checks  Disposition / Summary of Today's Plan 11/18/17   ICU, consider  Continue heparin Await echo May benefit from lysis.  Will need to consider A Line or CVC if catheter directed lysis for blood draws..    Diet: NPO after midnight Pain/Anxiety/Delirium protocol (if indicated): Morphine IV VAP protocol (if indicated): n/a DVT prophylaxis: full dose heparin GI prophylaxis:  Hyperglycemia  protocol: n/a Mobility: BR Code Status: Full Family Communication: patient updated 10/25 am.   Labs   CBC: Recent Labs  Lab 11/17/17 1753 11/18/17 0222  WBC 13.4* 11.5*  NEUTROABS 7.3  --   HGB 13.0 12.4  HCT 40.1 39.0  MCV 85.5 85.9  PLT 192 189    Basic Metabolic Panel: Recent Labs  Lab 11/17/17 1830  NA 137  K 3.8  CL 108  CO2 22  GLUCOSE 125*  BUN 16  CREATININE 0.81  CALCIUM 8.7*   GFR: Estimated Creatinine Clearance: 132.3 mL/min (by C-G formula based on SCr of 0.81 mg/dL). Recent Labs  Lab 11/17/17 1753 11/18/17 0222  WBC 13.4* 11.5*    Liver Function Tests: No results for input(s): AST, ALT, ALKPHOS, BILITOT, PROT, ALBUMIN in the last 168 hours. No results for input(s): LIPASE, AMYLASE in the last 168 hours. No results for input(s): AMMONIA in the last 168 hours.  ABG No results found for: PHART, PCO2ART, PO2ART, HCO3, TCO2, ACIDBASEDEF, O2SAT   Coagulation Profile: Recent Labs  Lab 11/18/17 0222  INR 1.15    Cardiac Enzymes: Recent Labs  Lab 11/17/17 1753  TROPONINI 0.05*    HbA1C: No results found for: HGBA1C  CBG: Recent Labs  Lab 11/17/17 2145  GLUCAP 127*    Admitting History of Present Illness.   37 year old female with PMH as below, which is significant for DVT/PE and chornic back pain from herniated disk. PE in the past was attributed to oral contraception and she was treated with 6 months of Xarelto. Hypercoagulable workup at that time was negative. She has been followed for herniated disk being treated with epidural injections, prednisone, cyclobenzaprine, and tramadol.  First injection was a couple weeks prior to arrival.   She first noted SOB 10/21 when carrying her dog up the stairs and needing to stop and catch her breath. She has been DOE since that time. She thought this was maybe due to the cyclobenzaprine she had been taking for her back, so she stopped this and waited in hopes symptoms would improve. They did now,  which lead her to present to the ED 10/24 with this complaint. CT angiogram of the chest was done demonstrating bilateral PE and R heart strain. She was transferred to [facility] for evaluation by PCCM. Of note, she has been out of work with worsening back pain from 10/21/2017-11/02/2017, decreased mobility during that time. This is similar to her first presentation in 2016.      Past Medical History  She,  has a past medical history of Chronic back pain, DVT (deep venous thrombosis) (HCC), and Pulmonary emboli (HCC).   Surgical History    Past Surgical History:  Procedure Laterality Date  . BACK SURGERY    . WISDOM TOOTH EXTRACTION    . WRIST FRACTURE SURGERY       Social History   Social History   Socioeconomic History  . Marital status: Single    Spouse name: Not on file  . Number of children: Not on file  . Years of education: Not on file  . Highest education level:  Not on file  Occupational History  . Not on file  Social Needs  . Financial resource strain: Not on file  . Food insecurity:    Worry: Not on file    Inability: Not on file  . Transportation needs:    Medical: Not on file    Non-medical: Not on file  Tobacco Use  . Smoking status: Never Smoker  . Smokeless tobacco: Never Used  Substance and Sexual Activity  . Alcohol use: No  . Drug use: No  . Sexual activity: Not on file  Lifestyle  . Physical activity:    Days per week: Not on file    Minutes per session: Not on file  . Stress: Not on file  Relationships  . Social connections:    Talks on phone: Not on file    Gets together: Not on file    Attends religious service: Not on file    Active member of club or organization: Not on file    Attends meetings of clubs or organizations: Not on file    Relationship status: Not on file  . Intimate partner violence:    Fear of current or ex partner: Not on file    Emotionally abused: Not on file    Physically abused: Not on file    Forced sexual  activity: Not on file  Other Topics Concern  . Not on file  Social History Narrative  . Not on file  ,  reports that she has never smoked. She has never used smokeless tobacco. She reports that she does not drink alcohol or use drugs.   Family History   Her family history is not on file.   Allergies Allergies  Allergen Reactions  . Mobic [Meloxicam] Swelling     Home Medications  Prior to Admission medications   Medication Sig Start Date End Date Taking? Authorizing Provider  Fexofenadine HCl (ALLEGRA PO) Take 1 tablet by mouth daily.    [provider]  Hydrocodone-Acetaminophen (VICODIN PO) Take by mouth.    [provider]  mometasone (NASONEX) 50 MCG/ACT nasal spray Place 2 sprays into the nose daily as needed (allergies).    [provider]  Multiple Vitamins-Minerals (HAIR/SKIN/NAILS) TABS Take 1 tablet by mouth daily.    [provider]  oxyCODONE-acetaminophen (PERCOCET/ROXICET) 5-325 MG per tablet Take 1 tablet by mouth every 4 (four) hours as needed for severe pain. 03/03/14   Elgergawy, Leana Roe, MD  Rivaroxaban (XARELTO STARTER PACK) 15 & 20 MG TBPK Take as directed on package: Start with one 15mg  tablet by mouth twice a day with food. On Day 22, switch to one 20mg  tablet once a day with food. 03/03/14   Elgergawy, Leana Roe, MD  valACYclovir (VALTREX) 500 MG tablet Take 500 mg by mouth daily.    [provider]     Critical care time:     Bevelyn Ngo, AGACNP-BC [facility] Pulmonary/Critical Care Medicine Pager # [phone] After 3 pm  [phone] 11/18/2017 10:21 AM

## 2017-11-18 NOTE — Progress Notes (Signed)
ANTICOAGULATION CONSULT NOTE  Pharmacy Consult:  Heparin Indication: pulmonary embolus  Allergies  Allergen Reactions  . Mobic [Meloxicam] Swelling    Patient Measurements: Height: 5\' 11"  (180.3 cm) Weight: 256 lb 6.3 oz (116.3 kg) IBW/kg (Calculated) : 70.8 Heparin Dosing Weight: 96 kg  Vital Signs: Temp: 98.5 F (36.9 C) (10/25 1200) Temp Source: Oral (10/25 1200) BP: 128/67 (10/25 1200) Pulse Rate: 109 (10/25 1200)  Labs: Recent Labs    11/17/17 1753 11/17/17 1830 11/18/17 0222 11/18/17 1134  HGB 13.0  --  12.4  --   HCT 40.1  --  39.0  --   PLT 192  --  189  --   LABPROT  --   --  14.6  --   INR  --   --  1.15  --   HEPARINUNFRC  --   --  0.62 0.43  CREATININE  --  0.81  --   --   TROPONINI 0.05*  --   --   --     Estimated Creatinine Clearance: 132.3 mL/min (by C-G formula based on SCr of 0.81 mg/dL).    Assessment: 54 YOF presenting with c/o SOB x4 days. Patient states she has history of DVT and PE and is not currently on anticoagulation. CT is positive for bilateral acute PE with evidence of RHS consistent with at least submassive PE. Pharmacy has been consulted for heparin for PE.   Heparin level is therapeutic; no bleeding reported.  Per documentation, no plan for EKOS.  Noted that she had a recent epidural injection.   Goal of Therapy:  Heparin level 0.3-0.7 units/ml Monitor platelets by anticoagulation protocol: Yes    Plan:  Continue heparin gtt at 1600 units/hr Daily heparin level and CBC   Ikeya Brockel D. Laney Potash, PharmD, BCPS, BCCCP 11/18/2017, 1:16 PM

## 2017-11-18 NOTE — Progress Notes (Signed)
Two attempts at a foley insertion for patient, third attempt will be made shortly. [name], RN 11/18/2017 10:43 AM

## 2017-11-18 NOTE — Progress Notes (Signed)
ANTICOAGULATION CONSULT NOTE   Pharmacy Consult for Heparin Indication: pulmonary embolus  Allergies  Allergen Reactions  . Mobic [Meloxicam] Swelling    Patient Measurements: Height: 5\' 11"  (180.3 cm) Weight: 256 lb 6.3 oz (116.3 kg) IBW/kg (Calculated) : 70.8 Heparin Dosing Weight: 96 kg  Vital Signs: Temp: 98.1 F (36.7 C) (10/25 0000) Temp Source: Oral (10/25 0000) BP: 124/93 (10/25 0300) Pulse Rate: 107 (10/25 0300)  Labs: Recent Labs    11/17/17 1753 11/17/17 1830 11/18/17 0222  HGB 13.0  --  12.4  HCT 40.1  --  39.0  PLT 192  --  189  HEPARINUNFRC  --   --  0.62  CREATININE  --  0.81  --   TROPONINI 0.05*  --   --     Estimated Creatinine Clearance: 132.3 mL/min (by C-G formula based on SCr of 0.81 mg/dL).   Medical History: Past Medical History:  Diagnosis Date  . Chronic back pain   . DVT (deep venous thrombosis) (HCC)   . Pulmonary emboli (HCC)     Medications:  Scheduled:  . heparin       Infusions:  . heparin 1,600 Units/hr (11/17/17 2200)    Assessment: Pt is a 33 YOF presenting with c/o SOB x4 days. Pt states he has h/o DVT and PE and is not currently on anticoagulation. CT is positive for bilateral acute PE with evidence of right heart strain consistent with at least submassive PE. Pharmacy has been consulted for heparin for PE. Hgb - 13, Plt - 192   10/25 AM update: initial heparin level therapeutic, CBC stable  Goal of Therapy:  Heparin level 0.3-0.7 units/ml Monitor platelets by anticoagulation protocol: Yes   Plan:  Cont heparin drip at 1600 units/hr Confirmatory heparin level at 1200 Daily HL and CBC  Herrick Hartog 11/18/2017,3:26 AM

## 2017-11-18 NOTE — Progress Notes (Signed)
  Echocardiogram 2D Echocardiogram has been performed.  Patient unable to lay in LLD position due to back pain.   Paula Mack L Androw 11/18/2017, 9:38 AM

## 2017-11-18 NOTE — Consult Note (Signed)
Chief Complaint: Patient was seen in consultation today for pulmonary arteriogram with possible EKOS thrombolysis  Chief Complaint  Patient presents with  . Shortness of Breath   at the request of Dr Leroy Libman   Supervising Physician: [name]  Patient Status: Paula Mack - In-pt  History of Present Illness: Paula Mack is a 37 y.o. female   CCM note  37 yr old female with PMHx of PE and RLE DVT in 2016 secondary to immobility and OCP use (Loestrin) was Rx with Xarelto for 6 mos. Had a negative hypercoagulable w/u at the time. Continues to have L5-S1 disc protrusion managed with epidural steroid injections last injection was 10/31/17. Takes flexeril for pain. P/w 4 days of SOB and palpitations she denies chest pain/ pressure, episodes of syncope, Ha, N/V. She states that she has had some blurry vision when breathing is at its worst. Lower ext swelling b/l which pt attributes to back issues and neuropathic issues.   CT yesterday:  IMPRESSION: 1. POSITIVE for BILATERAL acute PE with CT evidence of right heart strain (RV/LV Ratio = 2.01) consistent with at least submassive (intermediate risk) PE. The presence of right heart strain has been associated with an increased risk of morbidity and mortality.   Request for EKOS thrombolysis consideration Dr [name] has reviewed imaging and feels pt is a good candidate for same Await Echo  I have spoken to pt and family at bedside They have good understanding of procedure and potential risks I will re visit after Echo resulted and discussion with CCM  Past Medical History:  Diagnosis Date  . Chronic back pain   . DVT (deep venous thrombosis) (HCC)   . Pulmonary emboli [facility])     Past Surgical History:  Procedure Laterality Date  . BACK SURGERY    . WISDOM TOOTH EXTRACTION    . WRIST FRACTURE SURGERY      Allergies: Mobic [meloxicam]  Medications: Prior to Admission medications   Medication Sig Start Date End Date Taking?  Authorizing Provider  Fexofenadine HCl (ALLEGRA PO) Take 1 tablet by mouth daily.    [provider]  Hydrocodone-Acetaminophen (VICODIN PO) Take by mouth.    [provider]  mometasone (NASONEX) 50 MCG/ACT nasal spray Place 2 sprays into the nose daily as needed (allergies).    [provider]  Multiple Vitamins-Minerals (HAIR/SKIN/NAILS) TABS Take 1 tablet by mouth daily.    [provider]  oxyCODONE-acetaminophen (PERCOCET/ROXICET) 5-325 MG per tablet Take 1 tablet by mouth every 4 (four) hours as needed for severe pain. 03/03/14   Paula, Leana Roe, MD  Rivaroxaban (XARELTO STARTER PACK) 15 & 20 MG TBPK Take as directed on package: Start with one 15mg  tablet by mouth twice a day with food. On Day 22, switch to one 20mg  tablet once a day with food. 03/03/14   Paula, Leana Roe, MD  valACYclovir (VALTREX) 500 MG tablet Take 500 mg by mouth daily.    [provider]     No family history on file.  Social History   Socioeconomic History  . Marital status: Single    Spouse name: Not on file  . Number of children: Not on file  . Years of education: Not on file  . Highest education level: Not on file  Occupational History  . Not on file  Social Needs  . Financial resource strain: Not on file  . Food insecurity:    Worry: Not on file    Inability: Not on file  .  Transportation needs:    Medical: Not on file    Non-medical: Not on file  Tobacco Use  . Smoking status: Never Smoker  . Smokeless tobacco: Never Used  Substance and Sexual Activity  . Alcohol use: No  . Drug use: No  . Sexual activity: Not on file  Lifestyle  . Physical activity:    Days per week: Not on file    Minutes per session: Not on file  . Stress: Not on file  Relationships  . Social connections:    Talks on phone: Not on file    Gets together: Not on file    Attends religious service: Not on file    Active member of club or organization: Not on file     Attends meetings of clubs or organizations: Not on file    Relationship status: Not on file  Other Topics Concern  . Not on file  Social History Narrative  . Not on file   Review of Systems: A 12 point ROS discussed and pertinent positives are indicated in the HPI above.  All other systems are negative.  Review of Systems  Constitutional: Positive for fatigue. Negative for activity change and fever.  Respiratory: Positive for chest tightness and shortness of breath. Negative for wheezing.   Cardiovascular: Negative for chest pain.  Gastrointestinal: Negative for abdominal pain.  Musculoskeletal: Negative for back pain and gait problem.  Neurological: Negative for weakness.  Psychiatric/Behavioral: Negative for behavioral problems and confusion.    Vital Signs: BP 133/79   Pulse (!) 103   Temp 98.3 F (36.8 C) (Oral)   Resp (!) 22   Ht 5\' 11"  (1.803 m)   Wt 256 lb 6.3 oz (116.3 kg)   LMP 10/27/2017 (Approximate)   SpO2 96%   BMI 35.76 kg/m   Physical Exam  Constitutional: She is oriented to person, place, and time. She appears well-nourished.  Eyes: EOM are normal.  Neck: Neck supple.  Cardiovascular: Normal rate, regular rhythm and normal heart sounds.  Pulmonary/Chest: Effort normal and breath sounds normal.  Abdominal: Soft. Bowel sounds are normal.  Musculoskeletal: Normal range of motion.       Right lower leg: Normal.       Left lower leg: Normal.  Neurological: She is alert and oriented to person, place, and time.  Skin: Skin is warm and dry.  Psychiatric: She has a normal mood and affect. Her behavior is normal.  Vitals reviewed.   Imaging: Ct Angio Chest Pe W And/or Wo Contrast  Result Date: 11/17/2017 CLINICAL DATA:  SOB upon exertion worsening for the last few days. Pt denies chest pain and denies back pain other than her normal lower back pain. Denies fevers, denies long trips, not on birth control, and no increase in caffeine or stimulants EXAM: CT  ANGIOGRAPHY CHEST WITH CONTRAST TECHNIQUE: Multidetector CT imaging of the chest was performed using the standard protocol during bolus administration of intravenous contrast. Multiplanar CT image reconstructions and MIPs were obtained to evaluate the vascular anatomy. CONTRAST:  ISOVUE-370 IOPAMIDOL (ISOVUE-370) INJECTION 76% COMPARISON:  None. FINDINGS: Cardiovascular: Heart size normal. No pericardial effusion. Central and segmental bilateral pulmonary emboli. Dilated right ventricle. RV/LV ratio 2.01. Adequate contrast opacification of the thoracic aorta with no evidence of dissection, aneurysm, or stenosis. There is classic 3-vessel brachiocephalic arch anatomy without proximal stenosis. No significant atheromatous change. Mediastinum/Nodes: No hilar or mediastinal adenopathy. Lungs/Pleura: No pleural effusion. No pneumothorax. Lungs are clear. Upper Abdomen: No acute findings Musculoskeletal: No  chest wall abnormality. No acute or significant osseous findings. Review of the MIP images confirms the above findings. IMPRESSION: 1. POSITIVE for BILATERAL acute PE with CT evidence of right heart strain (RV/LV Ratio = 2.01) consistent with at least submassive (intermediate risk) PE. The presence of right heart strain has been associated with an increased risk of morbidity and mortality. Please activate Code PE by paging [phone]. Electronically Signed   By: Corlis Leak M.D.   On: 11/17/2017 19:06   Dg Chest Portable 1 View  Result Date: 11/17/2017 CLINICAL DATA:  Shortness of breath. EXAM: PORTABLE CHEST 1 VIEW COMPARISON:  February 28, 2014 FINDINGS: Study limited due to rotated low volume portable technique. No pneumothorax. The cardiomediastinal silhouette is unremarkable. The lungs are clear. IMPRESSION: Limited study with no acute abnormality noted. Electronically Signed   By: Gerome Sam III M.D   On: 11/17/2017 18:48    Labs:  CBC: Recent Labs    11/17/17 1753 11/18/17 0222  WBC  13.4* 11.5*  HGB 13.0 12.4  HCT 40.1 39.0  PLT 192 189    COAGS: Recent Labs    11/18/17 0222  INR 1.15    BMP: Recent Labs    11/17/17 1830  NA 137  K 3.8  CL 108  CO2 22  GLUCOSE 125*  BUN 16  CALCIUM 8.7*  CREATININE 0.81  GFRNONAA >60  GFRAA >60    LIVER FUNCTION TESTS: No results for input(s): BILITOT, AST, ALT, ALKPHOS, PROT, ALBUMIN in the last 8760 hours.  TUMOR MARKERS: No results for input(s): AFPTM, CEA, CA199, CHROMGRNA in the last 8760 hours.  Assessment and Plan:  Hx DVT/PE secondary OCP 2016 Treated with Xarelto 6 mo Negative anticoag w/u New SOB x 4 days; CTA shows B PE with + Heart strain I have spoken to pr regarding EKOS thrombolysis per CCM request Will await Echo result-- I will touch base with CCM    Thank you for this interesting consult.  I greatly enjoyed meeting Briyana Badman and look forward to participating in their care.  A copy of this report was sent to the requesting provider on this date.  Electronically Signed: Robet Leu, PA-C 11/18/2017, 9:01 AM   I spent a total of 40 Minutes    in face to face in clinical consultation, greater than 50% of which was counseling/coordinating care for EKOS thrombolysis

## 2017-11-19 ENCOUNTER — Inpatient Hospital Stay (HOSPITAL_COMMUNITY): Payer: BLUE CROSS/BLUE SHIELD

## 2017-11-19 DIAGNOSIS — M7989 Other specified soft tissue disorders: Secondary | ICD-10-CM

## 2017-11-19 DIAGNOSIS — I2699 Other pulmonary embolism without acute cor pulmonale: Secondary | ICD-10-CM

## 2017-11-19 LAB — BASIC METABOLIC PANEL
ANION GAP: 8 (ref 5–15)
BUN: 8 mg/dL (ref 6–20)
CALCIUM: 9.2 mg/dL (ref 8.9–10.3)
CO2: 24 mmol/L (ref 22–32)
Chloride: 105 mmol/L (ref 98–111)
Creatinine, Ser: 0.8 mg/dL (ref 0.44–1.00)
GFR calc Af Amer: >60 mL/min (ref >60)
GFR calc non Af Amer: >60 mL/min (ref >60)
GLUCOSE: 118 mg/dL — AB (ref 70–99)
Potassium: 4.3 mmol/L (ref 3.5–5.1)
SODIUM: 137 mmol/L (ref 135–145)

## 2017-11-19 LAB — CBC
HCT: 39.5 % (ref 36.0–46.0)
Hemoglobin: 12.2 g/dL (ref 12.0–15.0)
MCH: 26.6 pg (ref 26.0–34.0)
MCHC: 30.9 g/dL (ref 30.0–36.0)
MCV: 86.2 fL (ref 80.0–100.0)
NRBC: 0 % (ref 0.0–0.2)
Platelets: 232 10*3/uL (ref 150–400)
RBC: 4.58 MIL/uL (ref 3.87–5.11)
RDW: 16.4 % — AB (ref 11.5–15.5)
WBC: 7.4 10*3/uL (ref 4.0–10.5)

## 2017-11-19 LAB — HEPARIN LEVEL (UNFRACTIONATED): HEPARIN UNFRACTIONATED: 0.43 [IU]/mL (ref 0.30–0.70)

## 2017-11-19 NOTE — Progress Notes (Signed)
Paula Mack is a 37 y.o. female patient admitted from ED awake, alert - oriented  X 4 - no acute distress noted.  VSS - Blood pressure 134/77, pulse (!) 121, temperature 98.8 F (37.1 C), resp. rate 20, height 5\' 11"  (1.803 m), weight 116.2 kg, last menstrual period 10/27/2017, SpO2 95 %.    IV in place, occlusive dsg intact without redness.  Orientation to room, and floor completed with information packet given to patient/family.  Patient declined safety video at this time.  Admission INP armband ID verified with patient/family, and in place.   SR up x 2, fall assessment complete, with patient and family able to verbalize understanding of risk associated with falls, and verbalized understanding to call nsg before up out of bed.  Call light within reach, patient able to voice, and demonstrate understanding.  Skin, clean-dry- intact without evidence of bruising, or skin tears.   No evidence of skin break down noted on exam.     Will cont to eval and treat per MD orders.  Marca Ancona, RN 11/19/2017 7:42 PM

## 2017-11-19 NOTE — Progress Notes (Signed)
ANTICOAGULATION CONSULT NOTE  Pharmacy Consult:  Heparin Indication: pulmonary embolus  Allergies  Allergen Reactions  . Mobic [Meloxicam] Swelling    Patient Measurements: Height: 5\' 11"  (180.3 cm) Weight: 256 lb 2.8 oz (116.2 kg) IBW/kg (Calculated) : 70.8 Heparin Dosing Weight: 96 kg  Vital Signs: Temp: 98.5 F (36.9 C) (10/26 1133) Temp Source: Oral (10/26 1133) BP: 103/70 (10/26 1000) Pulse Rate: 106 (10/26 1000)  Labs: Recent Labs    11/17/17 1753 11/17/17 1830 11/18/17 0222 11/18/17 1134 11/19/17 0500  HGB 13.0  --  12.4  --  12.2  HCT 40.1  --  39.0  --  39.5  PLT 192  --  189  --  232  LABPROT  --   --  14.6  --   --   INR  --   --  1.15  --   --   HEPARINUNFRC  --   --  0.62 0.43 0.43  CREATININE  --  0.81  --   --  0.80  TROPONINI 0.05*  --   --   --   --     Estimated Creatinine Clearance: 134 mL/min (by C-G formula based on SCr of 0.8 mg/dL).    Assessment: 53 YOF presenting with c/o SOB x4 days. Patient states she has history of DVT and PE and is not currently on anticoagulation. CT is positive for bilateral acute PE with evidence of RHS consistent with at least submassive PE. Pharmacy has been consulted for heparin for PE.   Heparin level is therapeutic on 1600 units/hr; no bleeding reported. H/H and plt stable.  Per documentation, no plan for EKOS.  Noted that she had a recent epidural injection.   Goal of Therapy:  Heparin level 0.3-0.7 units/ml Monitor platelets by anticoagulation protocol: Yes    Plan:  Continue heparin gtt at 1600 units/hr Daily heparin level and CBC F/u plans for transition to oral anticoagulation    Vinnie Level, PharmD., BCPS Clinical Pharmacist Clinical phone for 11/19/17 until 3:30pm: [phone] If after 3:30pm, please refer to [facility] for unit-specific pharmacist

## 2017-11-19 NOTE — Progress Notes (Signed)
Preliminary results: Bilateral lower extremity venous duplex. Right: There is no evidence of deep vein thrombosis in the lower extremity. No cystic structure found in the popliteal fossa.  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 No cystic structure found in the popliteal fossa.   Levin Bacon- RDMS, RVT 12:43 PM  11/19/2017

## 2017-11-19 NOTE — Progress Notes (Signed)
Pt report called to receiving RN, all question answered, pt verbalized understanding of tx

## 2017-11-19 NOTE — Progress Notes (Addendum)
NAME:  Paula Mack, MRN:  [id number], DOB:  1980/12/05, LOS: 2 ADMISSION DATE:  11/17/2017, CONSULTATION DATE:  10/24  REFERRING MD:  Dr. Rubin Payor EDP, CHIEF COMPLAINT:  Pulmonary Embolism   Brief History   37 year old with previous PE ( 02/2014, treated with Lesia Hausen x 6 months) again being admitted 10/24 for PE with R heart strain. HD stable and on room air.   Past Medical History  Pulmonary embolism, DVT, herniated disk.  Last epidural injection 10/31/2017  Significant Hospital Events   Epidural injection 10/31/2017 10/24 admit Consults: date of consult/date signed off & final recs:  10/25>>IR  Procedures (surgical and bedside):  10/25 Echo: The cavity size was moderately dilated. Wall  thickness was normal. Systolic function was moderately reduced.  RV systolic pressure (S, est): 57 mm Hg. EF 65-70% There was no evidence of elevated  ventricular filling pressure by Doppler parameters. Systolic pressure was moderately increased.  PA peak pressure: 57 mm Hg (S). Significant Diagnostic Tests:  CTA chest 10/24 > Bilateral acute PE with RV/LV ratio 2.01 LE doppler 10/26 > peroneal chronic DVT left  Micro Data:    Antimicrobials:     Subjective:  Feels a little better Having a lot of back pain  Objective   Blood pressure 98/78, pulse (!) 110, temperature 98.5 F (36.9 C), temperature source Oral, resp. rate 17, height 5\' 11"  (1.803 m), weight 116.2 kg, last menstrual period 10/27/2017, SpO2 96 %.        Intake/Output Summary (Last 24 hours) at 11/19/2017 1309 Last data filed at 11/19/2017 1200 Gross per 24 hour  Intake 644 ml  Output 1675 ml  Net -1031 ml   Filed Weights   11/17/17 2200 11/18/17 0400 11/19/17 0350  Weight: 116.3 kg 116.3 kg 116.2 kg    Examination: General:  Resting comfortably in bed HENT: NCAT OP clear PULM: CTA B, normal effort CV: Slightly tachy, no mgr GI: BS+, soft, nontender MSK: normal bulk and tone Neuro: awake, alert, no  distress, MAEW   Resolved Hospital Problem list     Assessment & Plan:   Acute recurrent sub massive pulmonary emboli, hemodynamically stable, RV strain > after discussion of risks and benefits patient elected to not undergo EKOS > continue heparin > if stable 10/27 start oral anticoagulant with pharmacy help (she wants Xarelto from prior experience) > will need life long anticoagulation > will need repeat Echo in 6 months > she will be dyspneic for weeks to months > monitor for bleeding  DVT > heparin   Herniated disk: being treated with epidural injections, prednisone, muscle relax .  S/p first injection 2 weeks ago.( 10/7)  - prn morphine for now - APAP prn - out of bed - PT consult  Meloxicam allergy? > discuss nature of this, consider toradol    Disposition / Summary of Today's Plan 11/19/17   Read above Move to tele, TRH    Diet:advance diet Pain/Anxiety/Delirium protocol (if indicated): Morphine IV VAP protocol (if indicated): n/a DVT prophylaxis: full dose heparin GI prophylaxis:  Hyperglycemia protocol: n/a Mobility: BR Code Status: Full Family Communication: discussed bedside with patient/boyfriend 10/26  Labs   CBC: Recent Labs  Lab 11/17/17 1753 11/18/17 0222 11/19/17 0500  WBC 13.4* 11.5* 7.4  NEUTROABS 7.3  --   --   HGB 13.0 12.4 12.2  HCT 40.1 39.0 39.5  MCV 85.5 85.9 86.2  PLT 192 189 232    Basic Metabolic Panel: Recent Labs  Lab 11/17/17 1830  11/19/17 0500  NA 137 137  K 3.8 4.3  CL 108 105  CO2 22 24  GLUCOSE 125* 118*  BUN 16 8  CREATININE 0.81 0.80  CALCIUM 8.7* 9.2   GFR: Estimated Creatinine Clearance: 134 mL/min (by C-G formula based on SCr of 0.8 mg/dL). Recent Labs  Lab 11/17/17 1753 11/18/17 0222 11/19/17 0500  WBC 13.4* 11.5* 7.4    Liver Function Tests: No results for input(s): AST, ALT, ALKPHOS, BILITOT, PROT, ALBUMIN in the last 168 hours. No results for input(s): LIPASE, AMYLASE in the last 168  hours. No results for input(s): AMMONIA in the last 168 hours.  ABG No results found for: PHART, PCO2ART, PO2ART, HCO3, TCO2, ACIDBASEDEF, O2SAT   Coagulation Profile: Recent Labs  Lab 11/18/17 0222  INR 1.15    Cardiac Enzymes: Recent Labs  Lab 11/17/17 1753  TROPONINI 0.05*    HbA1C: No results found for: HGBA1C  CBG: Recent Labs  Lab 11/17/17 2145  GLUCAP 127*     Heber Piqua, MD [facility] Pager: [phone] Cell: [phone] After 3pm or if no response, call [phone]  11/19/2017 1:09 PM

## 2017-11-20 DIAGNOSIS — I82452 Acute embolism and thrombosis of left peroneal vein: Secondary | ICD-10-CM

## 2017-11-20 LAB — CBC
HEMATOCRIT: 38.6 % (ref 36.0–46.0)
Hemoglobin: 12.3 g/dL (ref 12.0–15.0)
MCH: 27.5 pg (ref 26.0–34.0)
MCHC: 31.9 g/dL (ref 30.0–36.0)
MCV: 86.4 fL (ref 80.0–100.0)
PLATELETS: 234 10*3/uL (ref 150–400)
RBC: 4.47 MIL/uL (ref 3.87–5.11)
RDW: 16.2 % — AB (ref 11.5–15.5)
WBC: 6.4 10*3/uL (ref 4.0–10.5)
nRBC: 0 % (ref 0.0–0.2)

## 2017-11-20 LAB — HEPARIN LEVEL (UNFRACTIONATED): HEPARIN UNFRACTIONATED: 0.39 [IU]/mL (ref 0.30–0.70)

## 2017-11-20 MED ORDER — DOCUSATE SODIUM 100 MG PO CAPS
100.0000 mg | ORAL_CAPSULE | Freq: Two times a day (BID) | ORAL | Status: DC | PRN
  Administered 2017-11-20: 100 mg via ORAL
  Filled 2017-11-20: qty 1

## 2017-11-20 MED ORDER — RIVAROXABAN 15 MG PO TABS
15.0000 mg | ORAL_TABLET | Freq: Two times a day (BID) | ORAL | Status: DC
  Administered 2017-11-20 – 2017-11-21 (×2): 15 mg via ORAL
  Filled 2017-11-20 (×2): qty 1

## 2017-11-20 MED ORDER — GABAPENTIN 300 MG PO CAPS
600.0000 mg | ORAL_CAPSULE | Freq: Every day | ORAL | Status: DC
  Administered 2017-11-20: 600 mg via ORAL
  Filled 2017-11-20: qty 2

## 2017-11-20 MED ORDER — LORATADINE 10 MG PO TABS: 10.0000 mg | ORAL_TABLET | Freq: Every day | ORAL | Status: DC

## 2017-11-20 MED ORDER — CYCLOBENZAPRINE HCL 5 MG PO TABS: 7.5000 mg | ORAL_TABLET | Freq: Three times a day (TID) | ORAL | Status: DC | PRN

## 2017-11-20 MED ORDER — RIVAROXABAN 20 MG PO TABS: 20.0000 mg | ORAL_TABLET | Freq: Every day | ORAL | Status: DC

## 2017-11-20 MED ORDER — ACETAMINOPHEN 325 MG PO TABS
650.0000 mg | ORAL_TABLET | Freq: Four times a day (QID) | ORAL | Status: DC | PRN
  Administered 2017-11-20 – 2017-11-21 (×2): 650 mg via ORAL
  Filled 2017-11-20 (×2): qty 2

## 2017-11-20 MED ORDER — VALACYCLOVIR HCL 500 MG PO TABS
500.0000 mg | ORAL_TABLET | Freq: Every day | ORAL | Status: DC
  Administered 2017-11-20 – 2017-11-21 (×2): 500 mg via ORAL
  Filled 2017-11-20 (×2): qty 1

## 2017-11-20 NOTE — Progress Notes (Signed)
Pt c/o swelling and pain to right hand over the middle knuckles. Swelling improved with elevation, hand feels warm to touch as compared with left hand. Notified Blount, NP and ultrasound ordered. Will continue to monitor and assess.

## 2017-11-20 NOTE — Progress Notes (Signed)
ANTICOAGULATION CONSULT NOTE  Pharmacy Consult:  Heparin>>xarelto Indication: pulmonary embolus  Allergies  Allergen Reactions  . Mobic [Meloxicam] Swelling    Patient Measurements: Height: 5\' 11"  (180.3 cm) Weight: 262 lb 5.6 oz (119 kg) IBW/kg (Calculated) : 70.8 Heparin Dosing Weight: 96 kg  Vital Signs: Temp: 99.6 F (37.6 C) (10/27 1444) Temp Source: Oral (10/27 1444) BP: 116/61 (10/27 1444) Pulse Rate: 110 (10/27 1444)  Labs: Recent Labs    11/17/17 1753 11/17/17 1830  11/18/17 0222 11/18/17 1134 11/19/17 0500 11/20/17 0414  HGB 13.0  --   --  12.4  --  12.2 12.3  HCT 40.1  --   --  39.0  --  39.5 38.6  PLT 192  --   --  189  --  232 234  LABPROT  --   --   --  14.6  --   --   --   INR  --   --   --  1.15  --   --   --   HEPARINUNFRC  --   --    < > 0.62 0.43 0.43 0.39  CREATININE  --  0.81  --   --   --  0.80  --   TROPONINI 0.05*  --   --   --   --   --   --    < > = values in this interval not displayed.    Estimated Creatinine Clearance: 135.6 mL/min (by C-G formula based on SCr of 0.8 mg/dL).    Assessment: 29 YOF presenting with c/o SOB x4 days. Patient states she has history of DVT and PE and is not currently on anticoagulation. CT is positive for bilateral acute PE with evidence of RHS consistent with at least submassive PE. Pharmacy has been consulted for heparin for PE.   Heparin level remains therapeutic on 1600 units/hr; no bleeding reported. H/H and plt stable.    PM: Plan to do lifelong xarelto. Transition today.   Goal of Therapy:  Heparin level 0.3-0.7 units/ml Monitor platelets by anticoagulation protocol: Yes    Plan:   Dc heparin/levels Xarelto 15mg  BID x21d then 20mg  qday F/u for bleeding  Ulyses Southward, PharmD, Suzan Nailer, AAHIVP, CPP Infectious Disease Pharmacist Pager: [phone] 11/20/2017 2:51 PM

## 2017-11-20 NOTE — Progress Notes (Signed)
ANTICOAGULATION CONSULT NOTE  Pharmacy Consult:  Heparin Indication: pulmonary embolus  Allergies  Allergen Reactions  . Mobic [Meloxicam] Swelling    Patient Measurements: Height: 5\' 11"  (180.3 cm) Weight: 262 lb 5.6 oz (119 kg) IBW/kg (Calculated) : 70.8 Heparin Dosing Weight: 96 kg  Vital Signs: Temp: 98.6 F (37 C) (10/27 0622) BP: 118/73 (10/27 0622) Pulse Rate: 103 (10/27 0622)  Labs: Recent Labs    11/17/17 1753 11/17/17 1830  11/18/17 0222 11/18/17 1134 11/19/17 0500 11/20/17 0414  HGB 13.0  --   --  12.4  --  12.2 12.3  HCT 40.1  --   --  39.0  --  39.5 38.6  PLT 192  --   --  189  --  232 234  LABPROT  --   --   --  14.6  --   --   --   INR  --   --   --  1.15  --   --   --   HEPARINUNFRC  --   --    < > 0.62 0.43 0.43 0.39  CREATININE  --  0.81  --   --   --  0.80  --   TROPONINI 0.05*  --   --   --   --   --   --    < > = values in this interval not displayed.    Estimated Creatinine Clearance: 135.6 mL/min (by C-G formula based on SCr of 0.8 mg/dL).    Assessment: Paula Mack presenting with c/o SOB x4 days. Patient states she has history of DVT and PE and is not currently on anticoagulation. CT is positive for bilateral acute PE with evidence of RHS consistent with at least submassive PE. Pharmacy has been consulted for heparin for PE.   Heparin level remains therapeutic on 1600 units/hr; no bleeding reported. H/H and plt stable.     Goal of Therapy:  Heparin level 0.3-0.7 units/ml Monitor platelets by anticoagulation protocol: Yes    Plan:  Continue heparin gtt at 1600 units/hr Daily heparin level and CBC Likely transition back to Xarelto soon.    Vinnie Level, PharmD., BCPS Clinical Pharmacist Clinical phone for 11/20/17 until 3:30pm: [phone] If after 3:30pm, please refer to [facility] for unit-specific pharmacist

## 2017-11-20 NOTE — Progress Notes (Signed)
[place] TEAM 1 - Stepdown/ICU TEAM  Paula Mack  ZOX:[id number] DOB: [birth year] DOA: 11/17/2017 PCP: Jeanice Lim, PA-C    Brief Narrative:  37yo F with a hx of PE 02/2014 (treated with Lesia Hausen x 6 months) who was admitted 10/24 for PE with R heart strain.  Significant Events: 10/24 admit 10/25 TTE - EF 65-70% - PA pressure 57 10/27 TRH assumed care   Subjective: Pt reports ongoing low back pain. Some sob, even w/ rest, but much worse w/ exertion. No signif chest pain.   Assessment & Plan:  Acute recurrent sub massive pulmonary emboli w/ RV strain patient elected to not undergo EKOS - plan to transition to Xarelto today as per PCCM suggestion - will need life long anticoagulation and repeat TTE in 6 months  L peroneal vein DVT tx as above   Herniated disk treated with epidural injections, prednisone, muscle relax - s/p first injection 10/31/17   DVT prophylaxis: IV heparin > Xarelto Code Status: FULL CODE Family Communication: no family present at time of exam  Disposition Plan: transfer to tele - ambulate - assess O2 needs - transition to oral anticoag - ?d/c ~48hrs   Consultants:  PCCM  IR  Antimicrobials:  none  Objective: Blood pressure 118/73, pulse (!) 103, temperature 98.6 F (37 C), resp. rate 16, height 5\' 11"  (1.803 m), weight 119 kg, last menstrual period 10/27/2017, SpO2 97 %.  Intake/Output Summary (Last 24 hours) at 11/20/2017 1351 Last data filed at 11/20/2017 0508 Gross per 24 hour  Intake 258.09 ml  Output 900 ml  Net -641.91 ml   Filed Weights   11/18/17 0400 11/19/17 0350 11/20/17 0500  Weight: 116.3 kg 116.2 kg 119 kg    Examination: General: No acute respiratory distress Lungs: Clear to auscultation bilaterally without wheezes or crackles Cardiovascular: tachycardic but regular - no M or rub  Abdomen: Nontender, nondistended, soft, bowel sounds positive, no rebound, no ascites, no appreciable mass Extremities: No  significant cyanosis, clubbing, or edema bilateral lower extremities  CBC: Recent Labs  Lab 11/17/17 1753 11/18/17 0222 11/19/17 0500 11/20/17 0414  WBC 13.4* 11.5* 7.4 6.4  NEUTROABS 7.3  --   --   --   HGB 13.0 12.4 12.2 12.3  HCT 40.1 39.0 39.5 38.6  MCV 85.5 85.9 86.2 86.4  PLT 192 189 232 234   Basic Metabolic Panel: Recent Labs  Lab 11/17/17 1830 11/19/17 0500  NA 137 137  K 3.8 4.3  CL 108 105  CO2 22 24  GLUCOSE 125* 118*  BUN 16 8  CREATININE 0.81 0.80  CALCIUM 8.7* 9.2   GFR: Estimated Creatinine Clearance: 135.6 mL/min (by C-G formula based on SCr of 0.8 mg/dL).  Liver Function Tests: No results for input(s): AST, ALT, ALKPHOS, BILITOT, PROT, ALBUMIN in the last 168 hours. No results for input(s): LIPASE, AMYLASE in the last 168 hours. No results for input(s): AMMONIA in the last 168 hours.  Coagulation Profile: Recent Labs  Lab 11/18/17 0222  INR 1.15    Cardiac Enzymes: Recent Labs  Lab 11/17/17 1753  TROPONINI 0.05*    CBG: Recent Labs  Lab 11/17/17 2145  GLUCAP 127*    Recent Results (from the past 240 hour(s))  MRSA PCR Screening     Status: None   Collection Time: 11/17/17 10:11 PM  Result Value Ref Range Status   MRSA by PCR NEGATIVE NEGATIVE Final    Comment:        The GeneXpert MRSA Assay (  FDA approved for NASAL specimens only), is one component of a comprehensive MRSA colonization surveillance program. It is not intended to diagnose MRSA infection nor to guide or monitor treatment for MRSA infections. Performed at [facility], [address], [place], [place] [address]      Scheduled Meds: Continuous Infusions: . heparin 1,600 Units/hr (11/20/17 0915)     LOS: 3 days   Lonia Blood, MD [facility] Office  [phone] Pager - Text Page per Amion  If 7PM-7AM, please contact night-coverage per Amion 11/20/2017, 1:51 PM

## 2017-11-21 DIAGNOSIS — R609 Edema, unspecified: Secondary | ICD-10-CM

## 2017-11-21 LAB — COMPREHENSIVE METABOLIC PANEL
ALBUMIN: 3.4 g/dL — AB (ref 3.5–5.0)
ALK PHOS: 78 U/L (ref 38–126)
ALT: 29 U/L (ref 0–44)
AST: 21 U/L (ref 15–41)
Anion gap: 8 (ref 5–15)
BILIRUBIN TOTAL: 0.6 mg/dL (ref 0.3–1.2)
BUN: 8 mg/dL (ref 6–20)
CALCIUM: 9.6 mg/dL (ref 8.9–10.3)
CO2: 24 mmol/L (ref 22–32)
CREATININE: 0.81 mg/dL (ref 0.44–1.00)
Chloride: 103 mmol/L (ref 98–111)
GFR calc Af Amer: >60 mL/min (ref >60)
GFR calc non Af Amer: >60 mL/min (ref >60)
GLUCOSE: 113 mg/dL — AB (ref 70–99)
POTASSIUM: 4.2 mmol/L (ref 3.5–5.1)
Sodium: 135 mmol/L (ref 135–145)
Total Protein: 7.2 g/dL (ref 6.5–8.1)

## 2017-11-21 LAB — CBC
HEMATOCRIT: 41.1 % (ref 36.0–46.0)
HEMOGLOBIN: 13.2 g/dL (ref 12.0–15.0)
MCH: 27.6 pg (ref 26.0–34.0)
MCHC: 32.1 g/dL (ref 30.0–36.0)
MCV: 85.8 fL (ref 80.0–100.0)
NRBC: 0 % (ref 0.0–0.2)
Platelets: 292 10*3/uL (ref 150–400)
RBC: 4.79 MIL/uL (ref 3.87–5.11)
RDW: 16 % — ABNORMAL HIGH (ref 11.5–15.5)
WBC: 5.8 10*3/uL (ref 4.0–10.5)

## 2017-11-21 MED ORDER — RIVAROXABAN 20 MG PO TABS: 20.0000 mg | ORAL_TABLET | Freq: Every day | ORAL | 0 refills | Status: AC

## 2017-11-21 MED ORDER — RIVAROXABAN 15 MG PO TABS: 15.0000 mg | ORAL_TABLET | Freq: Two times a day (BID) | ORAL | 0 refills | Status: AC

## 2017-11-21 NOTE — Progress Notes (Signed)
Foley was D/C per MD order. Patient tolerated well. Will continue to monitor.

## 2017-11-21 NOTE — Discharge Instructions (Signed)
Follow with Primary MD Jeanice Lim, PA-C in 7 days   Get CBC, CMP checked  by Primary MD  in 5-7 days   Activity: As tolerated with Full fall precautions use walker/cane & assistance as needed  Disposition Home    Diet: Heart Healthy    Special Instructions: If you have smoked or chewed Tobacco  in the last 2 yrs please stop smoking, stop any regular Alcohol  and or any Recreational drug use.  On your next visit with your primary care physician please Get Medicines reviewed and adjusted.  Please request your Prim.MD to go over all Hospital Tests and Procedure/Radiological results at the follow up, please get all Hospital records sent to your Prim MD by signing hospital release before you go home.  If you experience worsening of your admission symptoms, develop shortness of breath, life threatening emergency, suicidal or homicidal thoughts you must seek medical attention immediately by calling 911 or calling your MD immediately  if symptoms less severe.  You Must read complete instructions/literature along with all the possible adverse reactions/side effects for all the Medicines you take and that have been prescribed to you. Take any new Medicines after you have completely understood and accpet all the possible adverse reactions/side effects.    Information on my medicine - XARELTO (rivaroxaban)  This medication education was reviewed with me or my healthcare representative as part of my discharge preparation.   WHY WAS XARELTO PRESCRIBED FOR YOU? Xarelto was prescribed to treat blood clots that may have been found in the veins of your legs (deep vein thrombosis) or in your lungs (pulmonary embolism) and to reduce the risk of them occurring again.  What do you need to know about Xarelto? The starting dose is one 15 mg tablet taken TWICE daily with food for the FIRST 21 DAYS then on (enter date)  12/11/17  the dose is changed to one 20 mg tablet taken ONCE A DAY with your  evening meal.  DO NOT stop taking Xarelto without talking to the health care provider who prescribed the medication.  Refill your prescription for 20 mg tablets before you run out.  After discharge, you should have regular check-up appointments with your healthcare provider that is prescribing your Xarelto.  In the future your dose may need to be changed if your kidney function changes by a significant amount.  What do you do if you miss a dose? If you are taking Xarelto TWICE DAILY and you miss a dose, take it as soon as you remember. You may take two 15 mg tablets (total 30 mg) at the same time then resume your regularly scheduled 15 mg twice daily the next day.  If you are taking Xarelto ONCE DAILY and you miss a dose, take it as soon as you remember on the same day then continue your regularly scheduled once daily regimen the next day. Do not take two doses of Xarelto at the same time.   Important Safety Information Xarelto is a blood thinner medicine that can cause bleeding. You should call your healthcare provider right away if you experience any of the following: ? Bleeding from an injury or your nose that does not stop. ? Unusual colored urine (red or dark brown) or unusual colored stools (red or black). ? Unusual bruising for unknown reasons. ? A serious fall or if you hit your head (even if there is no bleeding).  Some medicines may interact with Xarelto and might increase your risk of  bleeding while on Xarelto. To help avoid this, consult your healthcare provider or pharmacist prior to using any new prescription or non-prescription medications, including herbals, vitamins, non-steroidal anti-inflammatory drugs (NSAIDs) and supplements.  This website has more information on Xarelto: VisitDestination.com.br.

## 2017-11-21 NOTE — Progress Notes (Signed)
Patient able to void approximately 400 ml after foley was removed. Will continue to monitor.

## 2017-11-21 NOTE — Evaluation (Signed)
Physical Therapy Evaluation Patient Details Name: Paula Mack MRN: [id number] DOB: [birth year] Today's Date: 11/21/2017   History of Present Illness  37yo F with a hx of PE 02/2014 (treated with Lesia Hausen x 6 months) who was admitted 10/24 for PE with R heart strain.   Clinical Impression  Orders received for PT evaluation. Patient demonstrates deficits in functional mobility as indicated below. Will benefit from continued skilled PT to address deficits and maximize function. Will see as indicated and progress as tolerated.   OF NOTE: 95% on room air with HR 98 at rest, with ambulation HR 141 and saturations to 88% on room air, 2 minutes after activity rebounded to 93% on room air with HR 128. Nsg notified    Follow Up Recommendations Home health PT    Equipment Recommendations  None recommended by PT    Recommendations for Other Services       Precautions / Restrictions Precautions Precautions: Fall Precaution Comments: watch HR and O2 saturations Restrictions Weight Bearing Restrictions: No      Mobility  Bed Mobility Overal bed mobility: Independent                Transfers Overall transfer level: Modified independent               General transfer comment: increased time to power up, some intial instbaility but able to self adjust  Ambulation/Gait Ambulation/Gait assistance: Supervision Gait Distance (Feet): 140 Feet Assistive device: None Gait Pattern/deviations: Antalgic Gait velocity: decreased Gait velocity interpretation: <1.8 ft/sec, indicate of risk for recurrent falls General Gait Details: some instbaility with ambulation, increased DOE 3/4 with activity and saturations to 88% on room air with 141 HR.  Stairs            Wheelchair Mobility    Modified Rankin (Stroke Patients Only)       Balance Overall balance assessment: Mild deficits observed, not formally tested                                            Pertinent Vitals/Pain Pain Assessment: Faces Faces Pain Scale: Hurts even more Pain Location: right hip and leg pain (reports from back) Pain Descriptors / Indicators: Radiating;Burning Pain Intervention(s): Monitored during session;Patient requesting pain meds-RN notified    Home Living Family/patient expects to be discharged to:: Private residence Living Arrangements: Alone Available Help at Discharge: Family;Available PRN/intermittently Type of Home: House(condo) Home Access: Level entry     Home Layout: Two level Home Equipment: Walker - 2 wheels;Cane - single point      Prior Function Level of Independence: Independent               Hand Dominance   Dominant Hand: Right    Extremity/Trunk Assessment   Upper Extremity Assessment Upper Extremity Assessment: Overall WFL for tasks assessed    Lower Extremity Assessment Lower Extremity Assessment: Overall WFL for tasks assessed(not formally assessed)       Communication   Communication: No difficulties  Cognition Arousal/Alertness: Awake/alert Behavior During Therapy: WFL for tasks assessed/performed Overall Cognitive Status: Within Functional Limits for tasks assessed                                        General Comments      Exercises  Assessment/Plan    PT Assessment Patient needs continued PT services  PT Problem List Decreased activity tolerance;Decreased balance;Decreased mobility;Pain       PT Treatment Interventions DME instruction;Gait training;Stair training;Functional mobility training;Therapeutic activities;Therapeutic exercise;Balance training    PT Goals (Current goals can be found in the Care Plan section)  Acute Rehab PT Goals Patient Stated Goal: to feel better PT Goal Formulation: With patient Time For Goal Achievement: 12/05/17 Potential to Achieve Goals: Good    Frequency Min 3X/week   Barriers to discharge        Co-evaluation                AM-PAC PT "6 Clicks" Daily Activity  Outcome Measure Difficulty turning over in bed (including adjusting bedclothes, sheets and blankets)?: A Little Difficulty moving from lying on back to sitting on the side of the bed? : A Little Difficulty sitting down on and standing up from a chair with arms (e.g., wheelchair, bedside commode, etc,.)?: A Little Help needed moving to and from a bed to chair (including a wheelchair)?: A Little Help needed walking in hospital room?: A Little Help needed climbing 3-5 steps with a railing? : A Lot 6 Click Score: 17    End of Session   Activity Tolerance: Patient tolerated treatment well;Patient limited by fatigue Patient left: in chair;with call bell/phone within reach Nurse Communication: Mobility status PT Visit Diagnosis: Unsteadiness on feet (R26.81);Difficulty in walking, not elsewhere classified (R26.2)    Time: 1610-9604 PT Time Calculation (min) (ACUTE ONLY): 17 min   Charges:   PT Evaluation $PT Eval Moderate Complexity: 1 Mod          Charlotte Crumb, PT DPT  Board Certified Neurologic Specialist Acute Rehabilitation Services Pager [phone] Office [phone]   Fabio Asa 11/21/2017, 11:11 AM

## 2017-11-21 NOTE — Progress Notes (Signed)
SATURATION QUALIFICATIONS: (This note is used to comply with regulatory documentation for home oxygen)  Patient Saturations on Room Air at Rest = 95%  Patient Saturations on Room Air while Ambulating = 88%  Patient Saturations on 2 Liters of oxygen while Ambulating = 90%  Please briefly explain why patient needs home oxygen: HR=131 on ambulation

## 2017-11-21 NOTE — Progress Notes (Signed)
     Paula Mack was admitted to the Hospital on 11/17/2017 and Discharged  11/21/2017 and should be excused from work/school   for 10  days starting from date -  11/17/2017 , may return to work/school without any restrictions.  Call Susa Raring MD, [facility]  [phone] with questions.  Susa Raring M.D on 11/21/2017,at 10:56 AM  [facility]   Office  [phone]

## 2017-11-21 NOTE — Care Management Note (Signed)
Case Management Note  Patient Details  Name: Ammarie Matsuura MRN: [id number] Date of Birth: 1980-07-21  Subjective/Objective:    37yo F with a hx of PE 02/2014 (treated with Lesia Hausen x 6 months) who was admitted 10/24 for PE with R heart strain.  PTA, pt independent of ADLS; lives alone.                  Action/Plan: PT recommending HH follow up, OT recommending no OP follow up and BSC for home.  Pt declines HHPT at this time.  Pt does have noted drop in O2 sats with ambulation; will require home oxygen.  Referral to [facility] for home O2 and BSC.  Pt discharging on Xarelto; free 30 day and copay cards given to pt.    Expected Discharge Date:  11/21/17               Expected Discharge Plan:  Home w Home Health Services  In-House Referral:     Discharge planning Services  CM Consult, Medication Assistance  Post Acute Care Choice:    Choice offered to:     DME Arranged:  3-N-1, Oxygen DME Agency:  [facility]  HH Arranged:  Patient Refused [facility] HH Agency:     Status of Service:  Completed, signed off  If discussed at Long Length of Stay Meetings, dates discussed:    Additional Comments:  Quintella Baton, RN, BSN  Trauma/Neuro ICU Case Manager [phone]

## 2017-11-21 NOTE — Discharge Summary (Signed)
Paula Mack ZOX:[id number] DOB: 21-Sep-1980 DOA: 11/17/2017  PCP: Jeanice Lim, PA-C  Admit date: 11/17/2017  Discharge date: 11/21/2017  Admitted From: Home   Disposition:  Home   Recommendations for Outpatient Follow-up:   Follow up with PCP in 1-2 weeks  PCP Please obtain BMP/CBC, 2 view CXR in 1week,  (see Discharge instructions)   PCP Please follow up on the following pending results:    Home Health: PT,RN   Equipment/Devices: o2  Consultations: PCCM Discharge Condition: Fair   CODE STATUS: Full   Diet Recommendation: Heart Healthy   Chief Complaint  Patient presents with  . Shortness of Breath     Brief history of present illness from the day of admission and additional interim summary    37yo F with a hx of PE 02/2014 (treated with Lesia Hausen x 6 months) who was admitted 10/24 for PE with R heart strain.                                                                  Hospital Course    1. Large PE with mild R.Heart Strain with chronic left lower extremity DVT -  history of DVT and PE in the past as well currently not on anticoagulation- she declined EKOS treatment, she was treated by pulmonary critical care with IV heparin initially, transition to oral Xarelto yesterday tolerated it well, down to nasal cannula oxygen at rest, hemodynamically stable, upon ambulation pulse ox does drop to 87 and 88% hence she will get oxygen at least for the short-term at home.  Continue Xarelto indefinitely now as this is her second episode of DVT/PE.  Outpatient hematology follow-up.  Counseled on compliance.  Is completely symptom-free at rest, she works at home.  Will also set her up with home PT and RN upon discharge.  She is still mildly tachycardic but overall symptom-free at rest, will be provided home  oxygen, home PT RN as above, given 1 week off from work.  2.  HX of herniated disc.  Follow with PCP.   Discharge diagnosis     Active Problems:   Pulmonary embolism [facility])    Discharge instructions    Discharge Instructions    Diet - low sodium heart healthy   Complete by:  As directed    Discharge instructions   Complete by:  As directed    Follow with Primary MD Jeanice Lim, PA-C in 7 days   Get CBC, CMP checked  by Primary MD  in 5-7 days   Activity: As tolerated with Full fall precautions use walker/cane & assistance as needed  Disposition Home    Diet: Heart Healthy    Special Instructions: If you have smoked or chewed Tobacco  in the last 2 yrs please stop smoking, stop any  regular Alcohol  and or any Recreational drug use.  On your next visit with your primary care physician please Get Medicines reviewed and adjusted.  Please request your Prim.MD to go over all Hospital Tests and Procedure/Radiological results at the follow up, please get all Hospital records sent to your Prim MD by signing hospital release before you go home.  If you experience worsening of your admission symptoms, develop shortness of breath, life threatening emergency, suicidal or homicidal thoughts you must seek medical attention immediately by calling 911 or calling your MD immediately  if symptoms less severe.  You Must read complete instructions/literature along with all the possible adverse reactions/side effects for all the Medicines you take and that have been prescribed to you. Take any new Medicines after you have completely understood and accpet all the possible adverse reactions/side effects.   Increase activity slowly   Complete by:  As directed       Discharge Medications   Allergies as of 11/21/2017      Reactions   Mobic [meloxicam] Swelling      Medication List    TAKE these medications   ALLEGRA PO Take 1 tablet by mouth daily.   cyclobenzaprine 7.5 MG  tablet Commonly known as:  FEXMID Take 7.5 mg by mouth 3 (three) times daily as needed for muscle spasms.   diphenhydramine-acetaminophen 25-500 MG Tabs tablet Commonly known as:  TYLENOL PM Take 1 tablet by mouth at bedtime as needed.   fluticasone 50 MCG/ACT nasal spray Commonly known as:  FLONASE Place 2 sprays into both nostrils daily.   gabapentin 300 MG capsule Commonly known as:  NEURONTIN Take 600 mg by mouth at bedtime.   HAIR/SKIN/NAILS Tabs Take 1 tablet by mouth daily.   Melatonin 1 MG Tabs Take 1 mg by mouth at bedtime.   mometasone 50 MCG/ACT nasal spray Commonly known as:  NASONEX Place 2 sprays into the nose daily as needed (allergies).   Rivaroxaban 15 MG Tabs tablet Commonly known as:  XARELTO Take 1 tablet (15 mg total) by mouth 2 (two) times daily with a meal for 20 days.   rivaroxaban 20 MG Tabs tablet Commonly known as:  XARELTO Take 1 tablet (20 mg total) by mouth daily with supper. Start taking on:  12/12/2017   traMADol 50 MG tablet Commonly known as:  ULTRAM Take 50 mg by mouth every 8 (eight) hours as needed.   valACYclovir 500 MG tablet Commonly known as:  VALTREX Take 500 mg by mouth daily.            Durable Medical Equipment  (From admission, onward)         Start     Ordered   11/21/17 1034  For home use only DME oxygen  Once    Question Answer Comment  Mode or (Route) Nasal cannula   Liters per Minute 2   Frequency Continuous (stationary and portable oxygen unit needed)   Oxygen conserving device Yes   Oxygen delivery system Gas      11/21/17 1034          Follow-up Information    Haubert, Vernell Leep, PA-C. Schedule an appointment as soon as possible for a visit in 1 week(s).   Contact information: [address] [place] [address] [phone]        Levert Feinstein, MD. Schedule an appointment as soon as possible for a visit in 2 week(s).   Specialty:  Oncology Why:  recurrent  PE/DVT Contact  information: [address] [place] [address] [phone]           Major procedures and Radiology Reports - PLEASE review detailed and final reports thoroughly  -     10/24 admit 10/25 TTE - EF 65-70% - PA pressure 57 10/27 TRH assumed care   Leg [place]  Right: There is no evidence of deep vein thrombosis in the lower extremity. No cystic structure found in the popliteal fossa. 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 No cystic structure found in the popliteal fossa.  Ct Angio Chest Pe W And/or Wo Contrast  Result Date: 11/17/2017 CLINICAL DATA:  SOB upon exertion worsening for the last few days. Pt denies chest pain and denies back pain other than her normal lower back pain. Denies fevers, denies long trips, not on birth control, and no increase in caffeine or stimulants EXAM: CT ANGIOGRAPHY CHEST WITH CONTRAST TECHNIQUE: Multidetector CT imaging of the chest was performed using the standard protocol during bolus administration of intravenous contrast. Multiplanar CT image reconstructions and MIPs were obtained to evaluate the vascular anatomy. CONTRAST:  ISOVUE-370 IOPAMIDOL (ISOVUE-370) INJECTION 76% COMPARISON:  None. FINDINGS: Cardiovascular: Heart size normal. No pericardial effusion. Central and segmental bilateral pulmonary emboli. Dilated right ventricle. RV/LV ratio 2.01. Adequate contrast opacification of the thoracic aorta with no evidence of dissection, aneurysm, or stenosis. There is classic 3-vessel brachiocephalic arch anatomy without proximal stenosis. No significant atheromatous change. Mediastinum/Nodes: No hilar or mediastinal adenopathy. Lungs/Pleura: No pleural effusion. No pneumothorax. Lungs are clear. Upper Abdomen: No acute findings Musculoskeletal: No chest wall abnormality. No acute or significant osseous findings. Review of the MIP images confirms the above findings. IMPRESSION: 1.  POSITIVE for BILATERAL acute PE with CT evidence of right heart strain (RV/LV Ratio = 2.01) consistent with at least submassive (intermediate risk) PE. The presence of right heart strain has been associated with an increased risk of morbidity and mortality. Please activate Code PE by paging [phone]. Electronically Signed   By: Corlis Leak M.D.   On: 11/17/2017 19:06   Dg Chest Portable 1 View  Result Date: 11/17/2017 CLINICAL DATA:  Shortness of breath. EXAM: PORTABLE CHEST 1 VIEW COMPARISON:  February 28, 2014 FINDINGS: Study limited due to rotated low volume portable technique. No pneumothorax. The cardiomediastinal silhouette is unremarkable. The lungs are clear. IMPRESSION: Limited study with no acute abnormality noted. Electronically Signed   By: Gerome Sam III M.D   On: 11/17/2017 18:48    Micro Results     Recent Results (from the past 240 hour(s))  MRSA PCR Screening     Status: None   Collection Time: 11/17/17 10:11 PM  Result Value Ref Range Status   MRSA by PCR NEGATIVE NEGATIVE Final    Comment:        The GeneXpert MRSA Assay (FDA approved for NASAL specimens only), is one component of a comprehensive MRSA colonization surveillance program. It is not intended to diagnose MRSA infection nor to guide or monitor treatment for MRSA infections. Performed at [facility], [address], [place], [place] [address]     Today   Subjective    Paula Mack today has no headache,no chest abdominal pain,no new weakness tingling or numbness, feels much better wants to go home today.     Objective   Blood pressure (!) 123/56, pulse 95, temperature 98.6 F (37 C), resp. rate 18, height 5\' 11"  (1.803 m), weight 119 kg,  last menstrual period 10/27/2017, SpO2 98 %.   Intake/Output Summary (Last 24 hours) at 11/21/2017 1035 Last data filed at 11/21/2017 1008 Gross per 24 hour  Intake 120 ml  Output 4200 ml  Net -4080 ml    Exam Awake Alert, Oriented x 3,  No new F.N deficits, Normal affect Melvin.AT,PERRAL Supple Neck,No JVD, No cervical lymphadenopathy appriciated.  Symmetrical Chest wall movement, Good air movement bilaterally, CTAB RRR,No Gallops,Rubs or new Murmurs, No Parasternal Heave +ve B.Sounds, Abd Soft, Non tender, No organomegaly appriciated, No rebound -guarding or rigidity. No Cyanosis, Clubbing or edema, No new Rash or bruise   Data Review   CBC w Diff:  Lab Results  Component Value Date   WBC 5.8 11/21/2017   HGB 13.2 11/21/2017   HCT 41.1 11/21/2017   PLT 292 11/21/2017   LYMPHOPCT 37 11/17/2017   MONOPCT 6 11/17/2017   EOSPCT 2 11/17/2017   BASOPCT 0 11/17/2017    CMP:  Lab Results  Component Value Date   NA 135 11/21/2017   K 4.2 11/21/2017   CL 103 11/21/2017   CO2 24 11/21/2017   BUN 8 11/21/2017   CREATININE 0.81 11/21/2017   PROT 7.2 11/21/2017   ALBUMIN 3.4 (L) 11/21/2017   BILITOT 0.6 11/21/2017   ALKPHOS 78 11/21/2017   AST 21 11/21/2017   ALT 29 11/21/2017    Total Time in preparing paper work, data evaluation and todays exam - 35 minutes  Susa Raring M.D on 11/21/2017 at 10:35 AM  [facility]   Office  [phone]

## 2017-11-21 NOTE — Evaluation (Signed)
Occupational Therapy Evaluation and Discharge Patient Details Name: Paula Mack MRN: [id number] DOB: 04-Dec-1980 Today's Date: 11/21/2017    History of Present Illness 37yo F with a hx of PE 02/2014 (treated with Lesia Hausen x 6 months) who was admitted 10/24 for PE with R heart strain.    Clinical Impression   Pt currently at mod I for self care tasks. OT providing extensive energy conservation education  regarding self care and community mobility. Pt also having several questions regarding blind pet she has with OT recommending pt shower in morning spending day on main level with pet and that pt is not to carry pet up and down stairs secondary to fall risk. Pt with no further concerns at this time and no further need for acute OT intervention. OT signing off. Thank you for referral    Follow Up Recommendations  No OT follow up    Equipment Recommendations  3 in 1 bedside commode    Recommendations for Other Services Other (comment)(none at this time)     Precautions / Restrictions Precautions Precautions: Fall Precaution Comments: watch HR and O2 saturations Restrictions Weight Bearing Restrictions: No      Mobility Bed Mobility Overal bed mobility: Independent      Transfers Overall transfer level: Modified independent   General transfer comment: increased time to power up, some intial instbaility but able to self adjust    Balance Overall balance assessment: Mild deficits observed, not formally tested        ADL either performed or assessed with clinical judgement   ADL Overall ADL's : Modified independent      General ADL Comments: increased time to complete tasks secondary to fatigue.      Vision Baseline Vision/History: No visual deficits              Pertinent Vitals/Pain Pain Assessment: Faces Faces Pain Scale: Hurts little more Pain Location: right hip and leg pain (reports from back) Pain Descriptors / Indicators: Burning Pain Intervention(s):  Monitored during session;Patient requesting pain meds-RN notified     Hand Dominance Right   Extremity/Trunk Assessment Upper Extremity Assessment Upper Extremity Assessment: Overall WFL for tasks assessed   Lower Extremity Assessment Lower Extremity Assessment: Overall WFL for tasks assessed       Communication Communication Communication: No difficulties   Cognition Arousal/Alertness: Awake/alert Behavior During Therapy: WFL for tasks assessed/performed Overall Cognitive Status: Within Functional Limits for tasks assessed                  Home Living Family/patient expects to be discharged to:: Private residence Living Arrangements: Alone Available Help at Discharge: Family;Available PRN/intermittently Type of Home: House Home Access: Level entry     Home Layout: Two level Alternate Level Stairs-Number of Steps: full flight   Bathroom Shower/Tub: Walk-in shower         Home Equipment: Environmental consultant - 2 wheels;Cane - single point          Prior Functioning/Environment Level of Independence: Independent                          OT Goals(Current goals can be found in the care plan section) Acute Rehab OT Goals Patient Stated Goal: to feel better OT Goal Formulation: With patient Time For Goal Achievement: 12/05/17 Potential to Achieve Goals: Good   AM-PAC PT "6 Clicks" Daily Activity     Outcome Measure Help from another person eating meals?: None Help from another person taking  care of personal grooming?: None Help from another person toileting, which includes using toliet, bedpan, or urinal?: None Help from another person bathing (including washing, rinsing, drying)?: None Help from another person to put on and taking off regular upper body clothing?: None Help from another person to put on and taking off regular lower body clothing?: None 6 Click Score: 24   End of Session Equipment Utilized During Treatment: Rolling walker  Activity Tolerance:  Patient tolerated treatment well Patient left: in chair;with call bell/phone within reach  OT Visit Diagnosis: Muscle weakness (generalized) (M62.81)                Time: 1610-9604 OT Time Calculation (min): 19 min Charges:  OT General Charges $OT Visit: 1 Visit OT Evaluation $OT Eval Low Complexity: 1 Low   Osten Janek P 11/21/2017, 11:35 AM

## 2017-11-21 NOTE — Progress Notes (Addendum)
Patient was discharged home by MD order; discharged instructions  review and give to patient with care notes and prescriptions; Xarelto - free day and copay cards given; O 2 and BSC delivered to patient's room; IV DIC; skin intact; patient will be escorted to the D/C lounge by nurse tech via wheelchair.

## 2020-04-06 IMAGING — CT CT ANGIO CHEST
2 of 8 series · 19 of 36 positions shown · IV contrast (iopamidol)
Comparison: None.

CLINICAL DATA: SOB upon exertion worsening for the last few days.
Pt denies chest pain and denies back pain other than her normal
lower back pain. Denies fevers, denies long trips, not on birth
control, and no increase in caffeine or stimulants

EXAM:
CT ANGIOGRAPHY CHEST WITH CONTRAST
TECHNIQUE: Multidetector CT imaging of the chest was performed using the
standard protocol during bolus administration of intravenous
contrast. Multiplanar CT image reconstructions and MIPs were
obtained to evaluate the vascular anatomy.
CONTRAST:  100mL [id number] IOPAMIDOL ([id number]) INJECTION 76%

[Series 6: pe coronal mpr · coronal · 0.56mm/px · 1 of 81 slices shown]
[im 41/81  mediastinal]
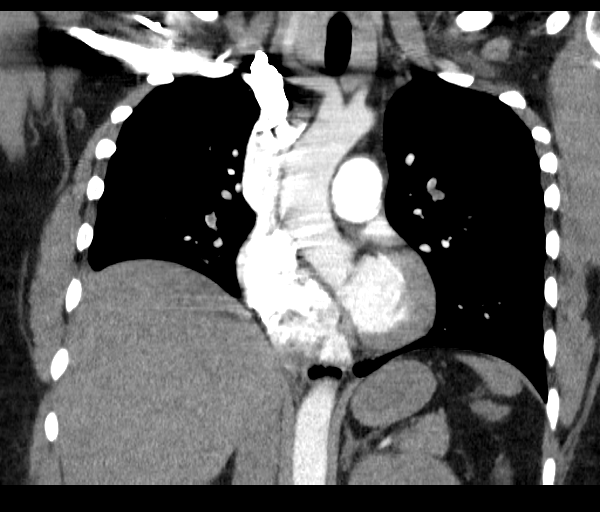

[Series 10: pe thins · axial · 0.73mm/px · z∈[-136,+99]mm · 18 of 349 slices shown]
[im 18/349  lung]
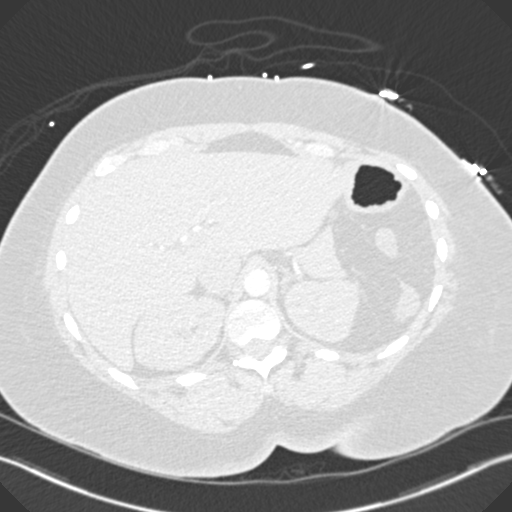
[im 35/349  mediastinal]
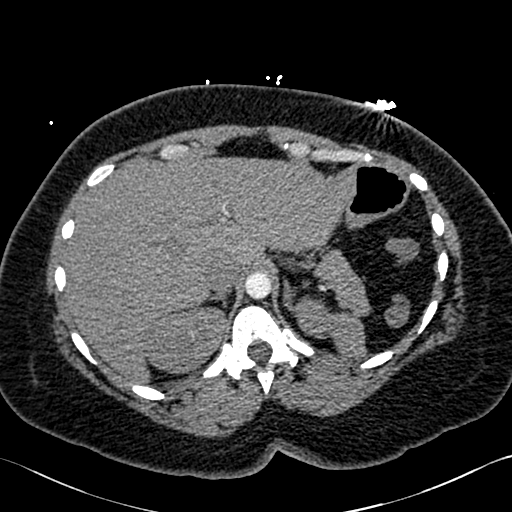
[im 53/349  lung]
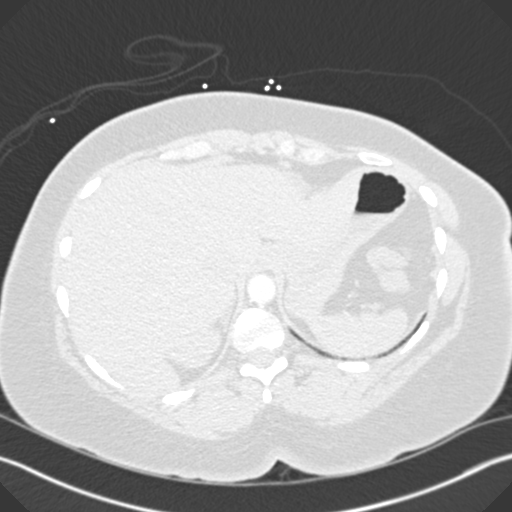
[im 70/349  mediastinal]
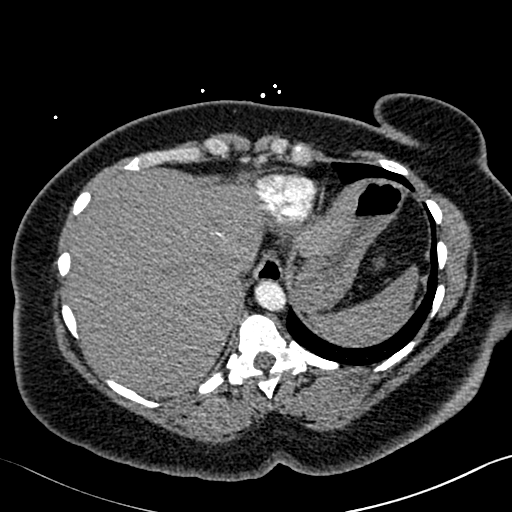
[im 88/349  lung]
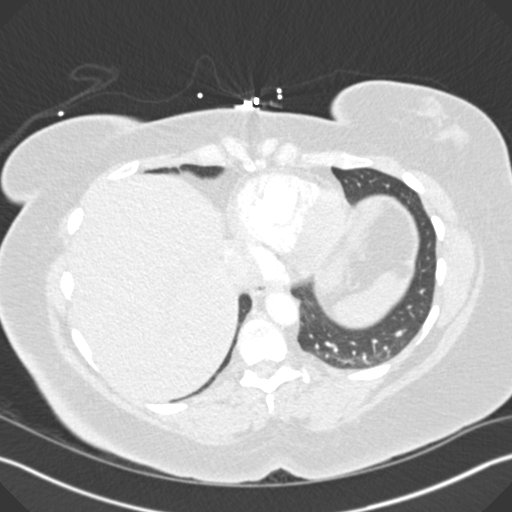
[im 105/349  mediastinal]
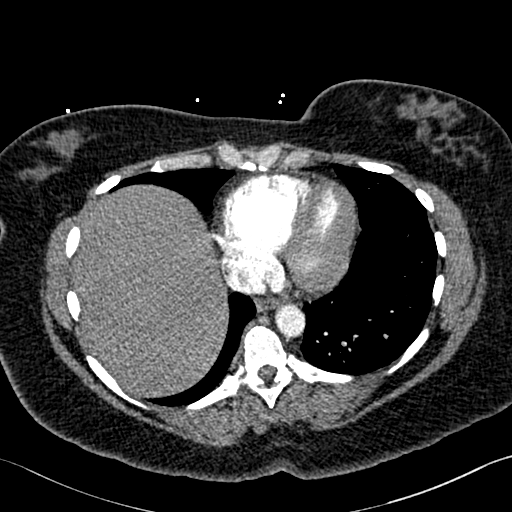
[im 122/349  lung]
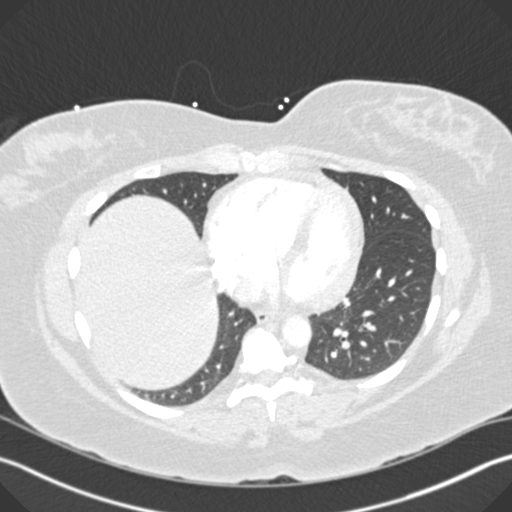
[im 140/349  mediastinal]
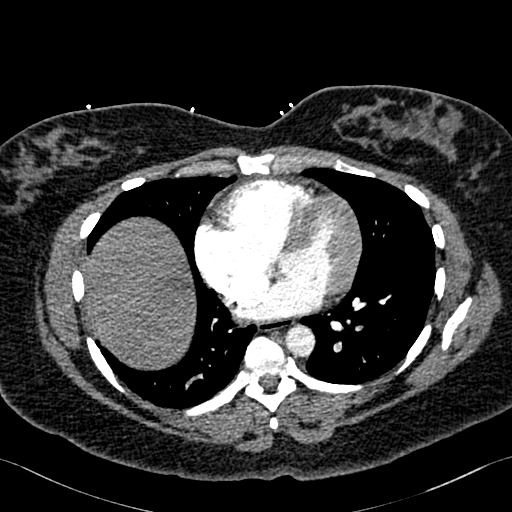
[im 157/349  lung]
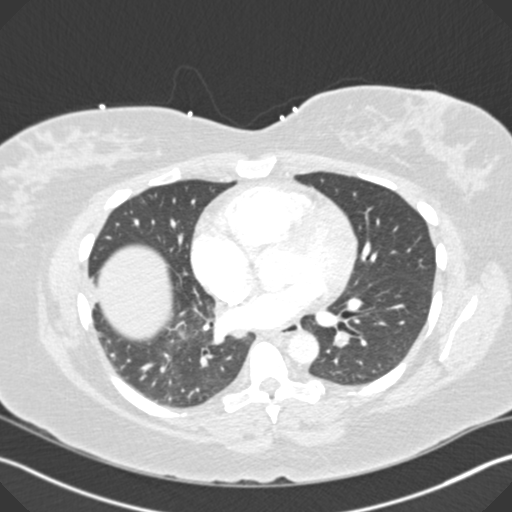
[im 192/349  mediastinal]
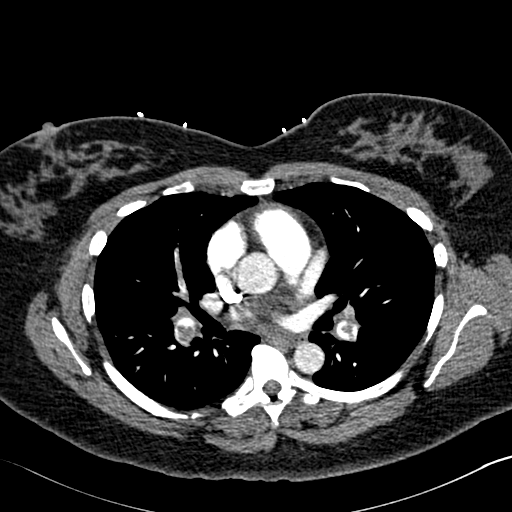
[im 209/349  lung]
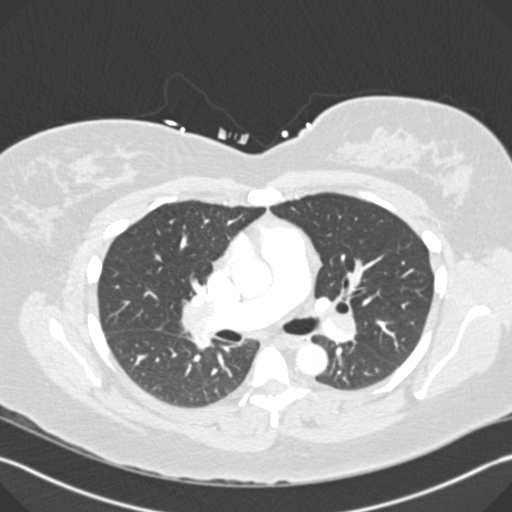
[im 227/349  mediastinal]
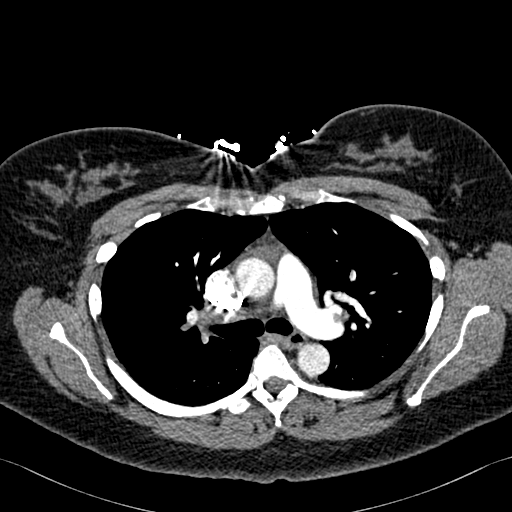
[im 244/349  lung]
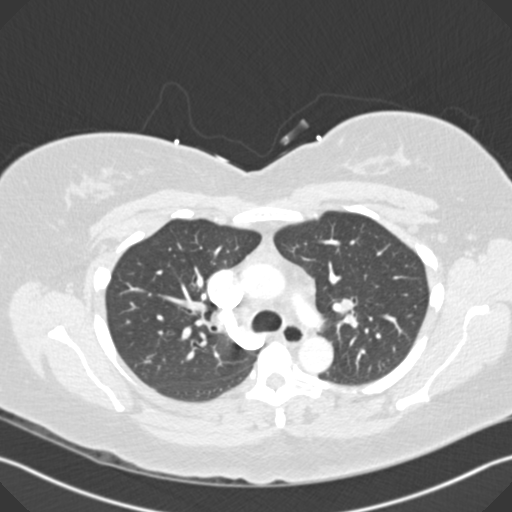
[im 262/349  mediastinal]
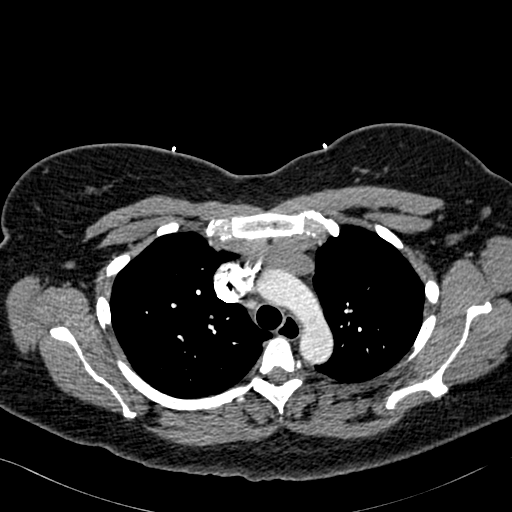
[im 279/349  lung]
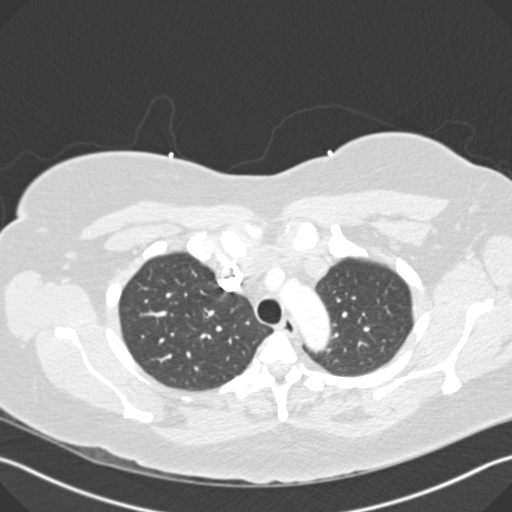
[im 296/349  mediastinal]
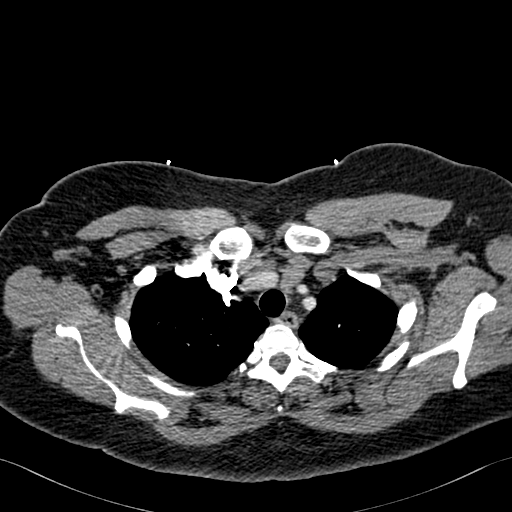
[im 314/349  lung]
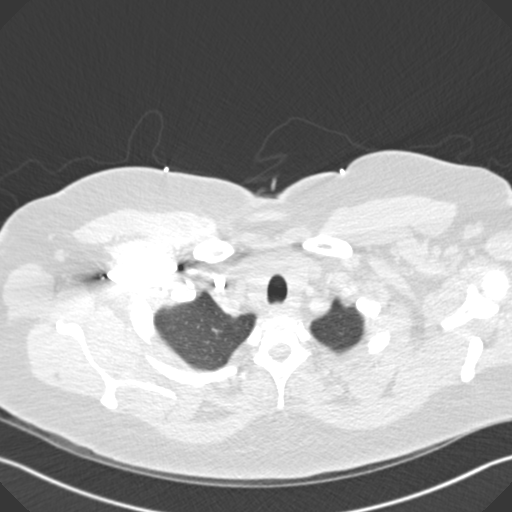
[im 331/349  mediastinal]
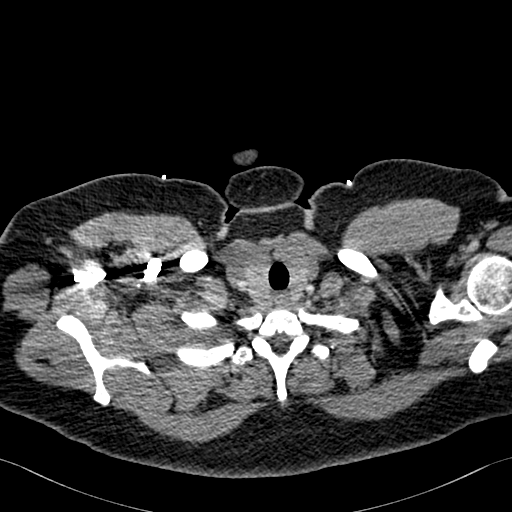

[19 of 36 positions shown; findings below may reference images not displayed]

FINDINGS: Cardiovascular: Heart size normal. No pericardial effusion. Central
and segmental bilateral pulmonary emboli. Dilated right ventricle.
RV/LV ratio 2.01. Adequate contrast opacification of the thoracic
aorta with no evidence of dissection, aneurysm, or stenosis. There
is classic 3-vessel brachiocephalic arch anatomy without proximal
stenosis. No significant atheromatous change.

Mediastinum/Nodes: No hilar or mediastinal adenopathy.

Lungs/Pleura: No pleural effusion. No pneumothorax. Lungs are clear.

Upper Abdomen: No acute findings

Musculoskeletal: No chest wall abnormality. No acute or significant
osseous findings.

Review of the MIP images confirms the above findings.
IMPRESSION: 1. POSITIVE for BILATERAL acute PE with CT evidence of right heart
strain (RV/LV Ratio = 2.01) consistent with at least submassive
(intermediate risk) PE. The presence of right heart strain has been
associated with an increased risk of morbidity and mortality. Please
activate Code PE by paging [phone].
